# Patient Record
Sex: Female | Born: 1985
Health system: Southern US, Community
[De-identification: ages and names within clinical notes are randomized; demographics above are authoritative.]

## PROBLEM LIST (undated history)

## (undated) DIAGNOSIS — K589 Irritable bowel syndrome without diarrhea: Secondary | ICD-10-CM

## (undated) DIAGNOSIS — E559 Vitamin D deficiency, unspecified: Secondary | ICD-10-CM

## (undated) HISTORY — DX: Irritable bowel syndrome, unspecified: K58.9

## (undated) HISTORY — DX: Vitamin D deficiency, unspecified: E55.9

## (undated) HISTORY — PX: WISDOM TOOTH EXTRACTION: SHX21

---

## 2015-12-09 DIAGNOSIS — F4323 Adjustment disorder with mixed anxiety and depressed mood: Secondary | ICD-10-CM | POA: Diagnosis not present

## 2016-01-07 DIAGNOSIS — F4323 Adjustment disorder with mixed anxiety and depressed mood: Secondary | ICD-10-CM | POA: Diagnosis not present

## 2016-01-13 DIAGNOSIS — F4323 Adjustment disorder with mixed anxiety and depressed mood: Secondary | ICD-10-CM | POA: Diagnosis not present

## 2016-01-28 DIAGNOSIS — F4323 Adjustment disorder with mixed anxiety and depressed mood: Secondary | ICD-10-CM | POA: Diagnosis not present

## 2016-02-16 DIAGNOSIS — F4323 Adjustment disorder with mixed anxiety and depressed mood: Secondary | ICD-10-CM | POA: Diagnosis not present

## 2016-03-04 DIAGNOSIS — F4323 Adjustment disorder with mixed anxiety and depressed mood: Secondary | ICD-10-CM | POA: Diagnosis not present

## 2016-03-14 DIAGNOSIS — F4323 Adjustment disorder with mixed anxiety and depressed mood: Secondary | ICD-10-CM | POA: Diagnosis not present

## 2016-03-25 DIAGNOSIS — F4323 Adjustment disorder with mixed anxiety and depressed mood: Secondary | ICD-10-CM | POA: Diagnosis not present

## 2016-04-25 DIAGNOSIS — F4323 Adjustment disorder with mixed anxiety and depressed mood: Secondary | ICD-10-CM | POA: Diagnosis not present

## 2016-05-09 DIAGNOSIS — F4323 Adjustment disorder with mixed anxiety and depressed mood: Secondary | ICD-10-CM | POA: Diagnosis not present

## 2016-05-23 DIAGNOSIS — F4323 Adjustment disorder with mixed anxiety and depressed mood: Secondary | ICD-10-CM | POA: Diagnosis not present

## 2016-06-06 DIAGNOSIS — F4323 Adjustment disorder with mixed anxiety and depressed mood: Secondary | ICD-10-CM | POA: Diagnosis not present

## 2016-07-19 DIAGNOSIS — J069 Acute upper respiratory infection, unspecified: Secondary | ICD-10-CM | POA: Diagnosis not present

## 2016-09-13 DIAGNOSIS — F4323 Adjustment disorder with mixed anxiety and depressed mood: Secondary | ICD-10-CM | POA: Diagnosis not present

## 2016-11-30 DIAGNOSIS — Z113 Encounter for screening for infections with a predominantly sexual mode of transmission: Secondary | ICD-10-CM | POA: Diagnosis not present

## 2016-11-30 DIAGNOSIS — Z7189 Other specified counseling: Secondary | ICD-10-CM | POA: Diagnosis not present

## 2017-04-13 DIAGNOSIS — F4323 Adjustment disorder with mixed anxiety and depressed mood: Secondary | ICD-10-CM | POA: Diagnosis not present

## 2017-05-05 ENCOUNTER — Encounter: Payer: Self-pay | Admitting: Urgent Care

## 2017-05-05 ENCOUNTER — Ambulatory Visit (INDEPENDENT_AMBULATORY_CARE_PROVIDER_SITE_OTHER): Payer: BLUE CROSS/BLUE SHIELD | Admitting: Urgent Care

## 2017-05-05 VITALS — BP 95/55 | HR 87 | Temp 98.2°F | Resp 17 | Ht 67.5 in | Wt 128.0 lb

## 2017-05-05 DIAGNOSIS — Z7189 Other specified counseling: Secondary | ICD-10-CM

## 2017-05-05 DIAGNOSIS — Z7185 Encounter for immunization safety counseling: Secondary | ICD-10-CM

## 2017-05-05 NOTE — Progress Notes (Signed)
MRN: [id number] DOB: 01-Nov-1985  Subjective:   Melanie Ho is a 31 y.o. female presenting for chief complaint of Immunizations (varicella)  Reports that she needs to verify her immunity to varicella. Reports that she had chicken pox at ~31 year old. Her immunizations are otherwise up to date per patient.  Melanie Ho is not currently taking any medications. Also has No Known Allergies.  Melanie Ho denies past medical and surgical history.   Objective:   Vitals: BP (!) 95/55   Pulse 87   Temp 98.2 F (36.8 C) (Oral)   Resp 17   Ht 5' 7.5" (1.715 m)   Wt 128 lb (58.1 kg)   LMP 04/21/2017 (Approximate)   SpO2 98%   BMI 19.75 kg/m   Physical Exam  Constitutional: She is oriented to person, place, and time. She appears well-developed and well-nourished.  Cardiovascular: Normal rate.   Pulmonary/Chest: Effort normal.  Neurological: She is alert and oriented to person, place, and time.  Psychiatric: She has a normal mood and affect.   Assessment and Plan :   1. Encounter for counseling regarding immunization - Labs pending, will follow up with lab results. - Varicella zoster antibody, IgG   Wallis Bamberg, PA-C [facility] [phone] 05/05/2017  8:55 AM

## 2017-05-05 NOTE — Patient Instructions (Addendum)
Varicella (Chickenpox) Vaccine: What You Need to Know 1. Why get vaccinated? Varicella (also called chickenpox) is a very contagious viral disease. It is caused by the varicella zoster virus. Chickenpox is usually mild, but it can be serious in infants under 53 months of age, adolescents, adults, pregnant women, and people with weakened immune systems. Chickenpox causes an itchy rash that usually lasts about a week. It can also cause:  fever  tiredness  loss of appetite  headache  More serious complications can include:  skin infections  infection of the lungs (pneumonia)  inflammation of blood vessels  swelling of the brain and/or spinal cord coverings (encephalitis or meningitis)  blood stream, bone, or joint infections  Some people get so sick that they need to be hospitalized. It doesn't happen often, but people can die from chickenpox. Before varicella vaccine, almost everyone in the [place] got chickenpox, an average of 4 million people each year. Children who get chickenpox usually miss at least 5 or 6 days of school or childcare. Some people who get chickenpox get a painful rash called shingles (also known as herpes zoster) years later. Chickenpox can spread easily from an infected person to anyone who has not had chickenpox and has not gotten chickenpox vaccine. 2. Chickenpox vaccine Children 12 months through 26 years of age should get 2 doses of chickenpox vaccine, usually:  First dose: 12 through 34 months of age  Second dose: 37 through 32 years of age  People 45 years of age or older who didn't get the vaccine when they were younger, and have never had chickenpox, should get 2 doses at least 28 days apart. A person who previously received only one dose of chickenpox vaccine should receive a second dose to complete the series. The second dose should be given at least 3 months after the first dose for those younger than 13 years, and at least 28 days after the  first dose for those 6 years of age or older. There are no known risks to getting chickenpox vaccine at the same time as other vaccines. There is a combination vaccine called MMRV that contains both chickenpox and MMR vaccines. MMRV is an option for some children 12 months through 43 years of age. There is a separate Vaccine Information Statement for MMRV. Your health care provider can give you more information. 3. Some people should not get this vaccine Tell your vaccine provider if the person getting the vaccine:  Has any severe, life-threatening allergies. A person who has ever had a life-threatening allergic reaction after a dose of chickenpox vaccine, or has a severe allergy to any part of this vaccine, may be advised not to be vaccinated. Ask your health care provider if you want information about vaccine components.  Is pregnant, or thinks she might be pregnant. Pregnant women should wait to get chickenpox vaccine until after they are no longer pregnant. Women should avoid getting pregnant for at least 1 month after getting chickenpox vaccine.  Has a weakened immune system due to disease (such as cancer or HIV/AIDS) or medical treatments (such as radiation, immunotherapy, steroids, or chemotherapy).  Has a parent, brother, or sister with a history of immune system problems.  Is taking salicylates (such as aspirin). People should avoid using salicylates for 6 weeks after getting varicella vaccine.  Has recently had a blood transfusion or received other blood products. You might be advised to postpone chickenpox vaccination for 3 months or more.  Has tuberculosis.  Has gotten  any other vaccines in the past 4 weeks. Live vaccines given too close together might not work as well.  Is not feeling well. A mild illness, such as a cold, is usually not a reason to postpone a vaccination. Someone who is moderately or severely ill should probably wait. Your doctor can advise you.  4. Risks of a  vaccine reaction With any medicine, including vaccines, there is a chance of reactions. These are usually mild and go away on their own, but serious reactions are also possible. Getting chickenpox vaccine is much safer than getting chickenpox disease. Most people who get chickenpox vaccine do not have any problems with it. After chickenpox vaccination, a person might experience: Minor events:  Sore arm from the injection  Fever  Redness or rash at the injection site If these events happen, they usually begin within 2 weeks after the shot. They occur less often after the second dose. More serious events following chickenpox vaccination are rare. They can include:  Seizure (jerking or staring) often associated with fever  Infection of the lungs (pneumonia) or the brain and spinal cord coverings (meningitis)  Rash all over the body  A person who develops a rash after chickenpox vaccination might be able to spread the varicella vaccine virus to an unprotected person. Even though this happens very rarely, anyone who gets a rash should stay away from people with weakened immune systems and unvaccinated infants until the rash goes away. Talk with your health care provider to learn more. Other things that could happen after this vaccine:  People sometimes faint after medical procedures, including vaccination. Sitting or lying down for about 15 minutes can help prevent fainting and injuries caused by a fall. Tell your doctor if you feel dizzy or have vision changes or ringing in the ears.  Some people get shoulder pain that can be more severe and longer-lasting than routine soreness that can follow injections. This happens very rarely.  Any medication can cause a severe allergic reaction. Such reactions to a vaccine are estimated at about 1 in a million doses, and would happen within a few minutes to a few hours after the vaccination. As with any medicine, there is a very remote chance of a  vaccine causing a serious injury or death. The safety of vaccines is always being monitored. For more information, visit: http://www.aguilar.org/ 5. What if there is a serious problem? What should I look for?  Look for anything that concerns you, such as signs of a severe allergic reaction, very high fever, or unusual behavior. Signs of a severe allergic reaction can include hives, swelling of the face and throat, difficulty breathing, a fast heartbeat, dizziness, and weakness. These would usually start a few minutes to a few hours after the vaccination. What should I do?  If you think it is a severe allergic reaction or other emergency that can't wait, call 9-1-1 and get to the nearest hospital. Otherwise, call your health care provider. Afterward, the reaction should be reported to the Vaccine Adverse Event Reporting System (VAERS). Your doctor should file this report, or you can do it yourself through the VAERS web site atwww.vaers.SamedayNews.es, or by calling 551-575-4386. VAERS does not give medical advice. 6. The National Vaccine Injury Compensation Program The [employer] Vaccine Injury Compensation Program (VICP) is a federal program that was created to compensate people who may have been injured by certain vaccines. Persons who believe they may have been injured by a vaccine can learn about the program and  about filing a claim by calling 267-209-7061 or visiting the [employer] website at GoldCloset.com.ee. There is a time limit to file a claim for compensation. 7. How can I learn more?  Ask your health care provider. He or she can give you the vaccine package insert or suggest other sources of information.  Call your local or state health department.  Contact the Centers for Disease Control and Prevention (CDC): ? Call 251-041-5927 (1-800-CDC-INFO) or ? Visit the CDC's website at http://hunter.com/ CDC Vaccine Information Statement (VIS) Varicella Vaccine  (10/10/2016) This information is not intended to replace advice given to you by your health care provider. Make sure you discuss any questions you have with your health care provider. Document Released: 06/09/2006 Document Revised: 10/25/2016 Document Reviewed: 10/25/2016 Elsevier Interactive Patient Education  2018 [employer].     IF you received an x-ray today, you will receive an invoice from [facility]. Please contact [facility] at [phone] with questions or concerns regarding your invoice.   IF you received labwork today, you will receive an invoice from [facility]. Please contact [facility] at [phone] with questions or concerns regarding your invoice.   Our billing staff will not be able to assist you with questions regarding bills from these companies.  You will be contacted with the lab results as soon as they are available. The fastest way to get your results is to activate your My Chart account. Instructions are located on the last page of this paperwork. If you have not heard from [place] regarding the results in 2 weeks, please contact this office.

## 2017-05-06 LAB — VARICELLA ZOSTER ANTIBODY, IGG: Varicella zoster IgG: 916 index (ref >165)

## 2017-05-09 ENCOUNTER — Encounter: Payer: Self-pay | Admitting: Radiology

## 2017-06-09 DIAGNOSIS — Z30011 Encounter for initial prescription of contraceptive pills: Secondary | ICD-10-CM | POA: Diagnosis not present

## 2017-06-09 DIAGNOSIS — Z01419 Encounter for gynecological examination (general) (routine) without abnormal findings: Secondary | ICD-10-CM | POA: Diagnosis not present

## 2017-07-07 DIAGNOSIS — R109 Unspecified abdominal pain: Secondary | ICD-10-CM | POA: Diagnosis not present

## 2017-07-07 DIAGNOSIS — L659 Nonscarring hair loss, unspecified: Secondary | ICD-10-CM | POA: Diagnosis not present

## 2017-07-11 DIAGNOSIS — F4323 Adjustment disorder with mixed anxiety and depressed mood: Secondary | ICD-10-CM | POA: Diagnosis not present

## 2017-08-01 DIAGNOSIS — F4323 Adjustment disorder with mixed anxiety and depressed mood: Secondary | ICD-10-CM | POA: Diagnosis not present

## 2017-08-09 ENCOUNTER — Other Ambulatory Visit: Payer: Self-pay | Admitting: Gastroenterology

## 2017-08-09 DIAGNOSIS — E611 Iron deficiency: Secondary | ICD-10-CM | POA: Diagnosis not present

## 2017-08-09 DIAGNOSIS — R1030 Lower abdominal pain, unspecified: Secondary | ICD-10-CM | POA: Diagnosis not present

## 2017-08-15 ENCOUNTER — Ambulatory Visit
Admission: RE | Admit: 2017-08-15 | Discharge: 2017-08-15 | Disposition: A | Discharge status: Home / Self Care | Payer: BLUE CROSS/BLUE SHIELD | Source: Ambulatory Visit | Attending: Gastroenterology | Admitting: Gastroenterology

## 2017-08-15 DIAGNOSIS — F4323 Adjustment disorder with mixed anxiety and depressed mood: Secondary | ICD-10-CM | POA: Diagnosis not present

## 2017-08-15 DIAGNOSIS — F909 Attention-deficit hyperactivity disorder, unspecified type: Secondary | ICD-10-CM | POA: Diagnosis not present

## 2017-08-15 DIAGNOSIS — K7689 Other specified diseases of liver: Secondary | ICD-10-CM | POA: Diagnosis not present

## 2017-08-15 DIAGNOSIS — R1030 Lower abdominal pain, unspecified: Secondary | ICD-10-CM

## 2017-08-15 IMAGING — CT CT ABD-PELV W/ CM
2 of 4 series · 10 of 36 positions shown, 17 images · IV contrast (READICAT/WATER & [ID] ISOVUE 300)
Comparison: None.

CLINICAL DATA: Recurrent lower pelvic pain, left greater than
right. Abdominal bloating. Symptoms for 1 year.

EXAM:
CT ABDOMEN AND PELVIS WITH CONTRAST
TECHNIQUE: Multidetector CT imaging of the abdomen and pelvis was performed
using the standard protocol following bolus administration of
intravenous contrast.
CONTRAST:  100 mL of [DE] intravenous contrast

[Series 3: abd/pelvis with · axial · 0.66mm/px · z∈[-358,-38]mm · 9 of 80 slices shown, 15 images]
[im 8/80  soft-tissue]
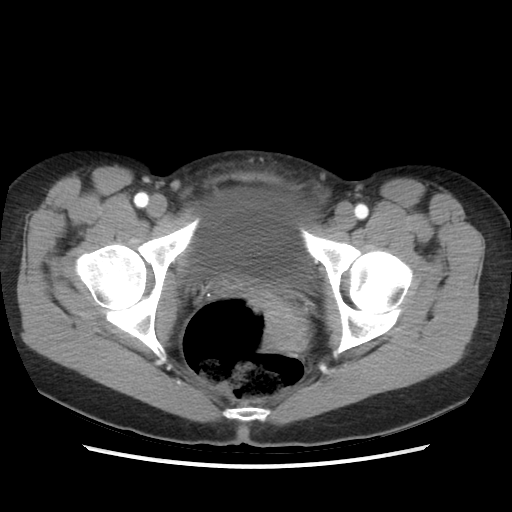
[im 8/80  bone]
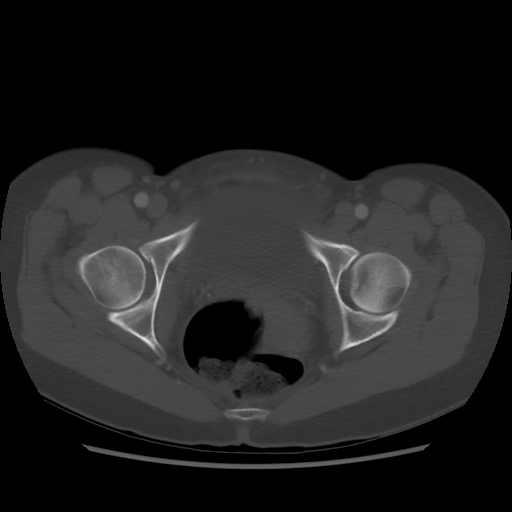
[im 16/80  soft-tissue]
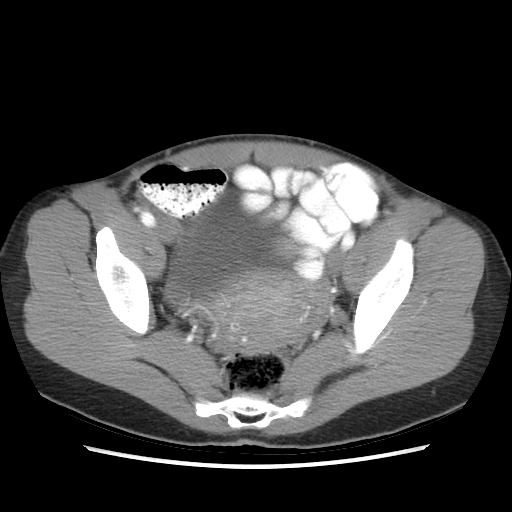
[im 24/80  soft-tissue]
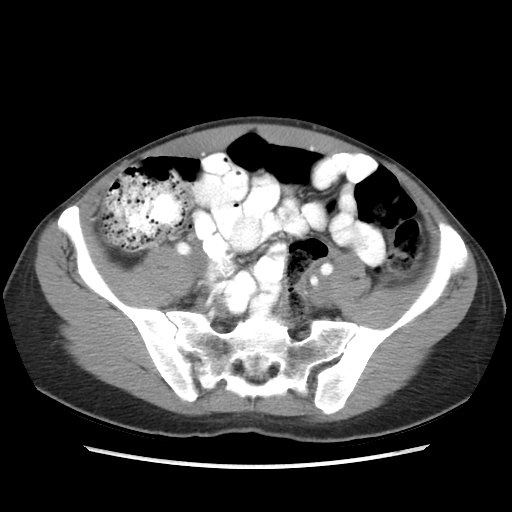
[im 32/80  soft-tissue]
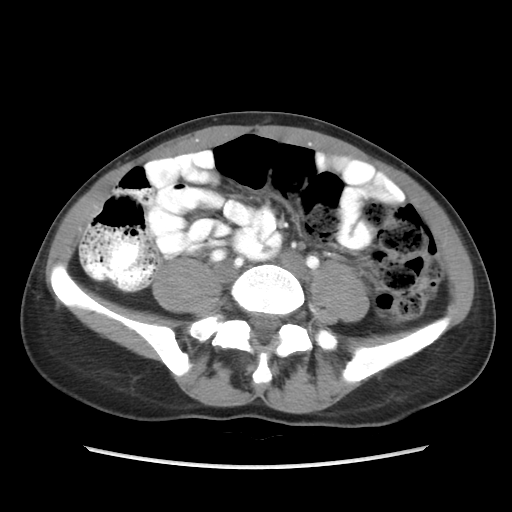
[im 40/80  soft-tissue]
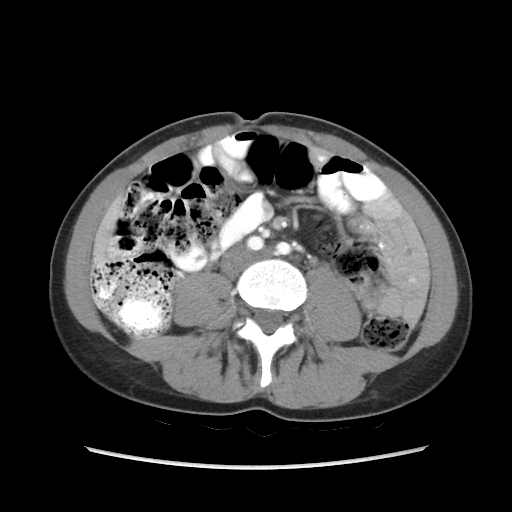
[im 48/80  soft-tissue]
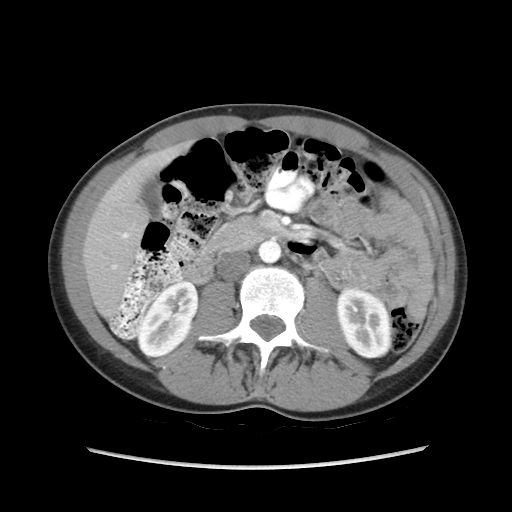
[im 48/80  lung]
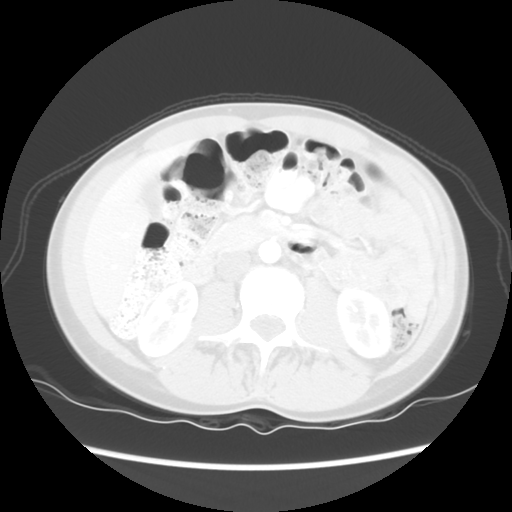
[im 56/80  soft-tissue]
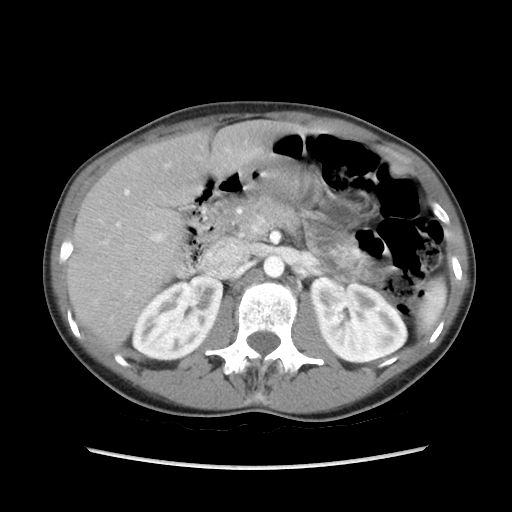
[im 56/80  lung]
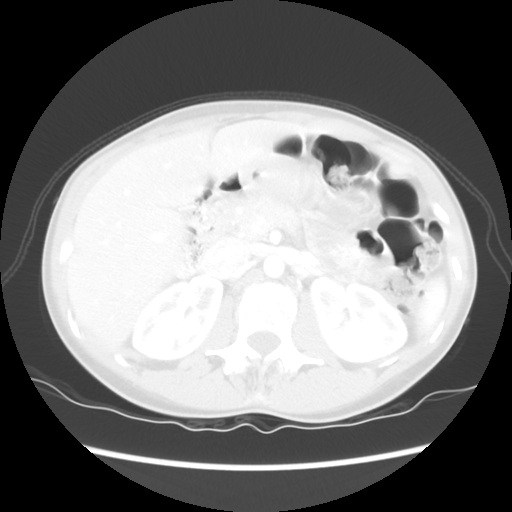
[im 64/80  soft-tissue]
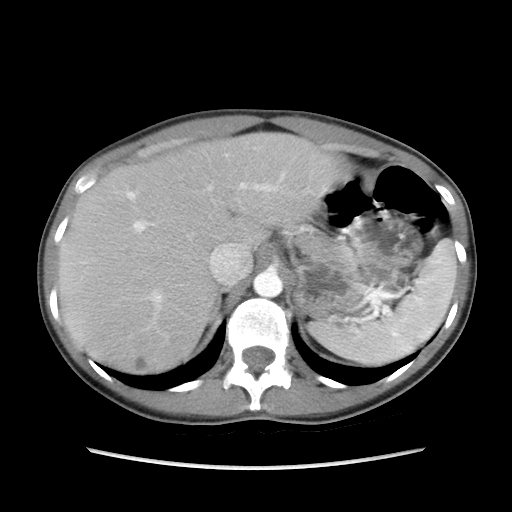
[im 64/80  lung]
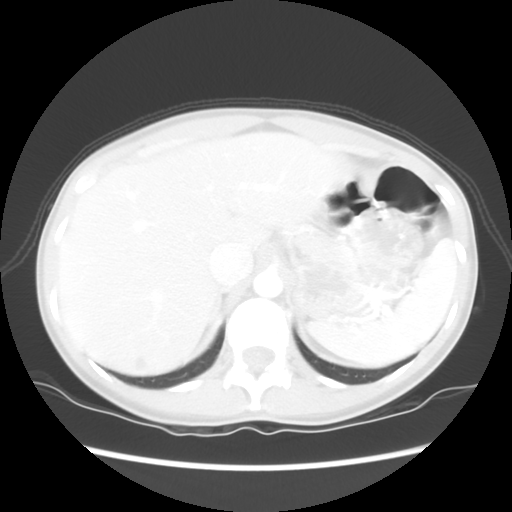
[im 72/80  soft-tissue]
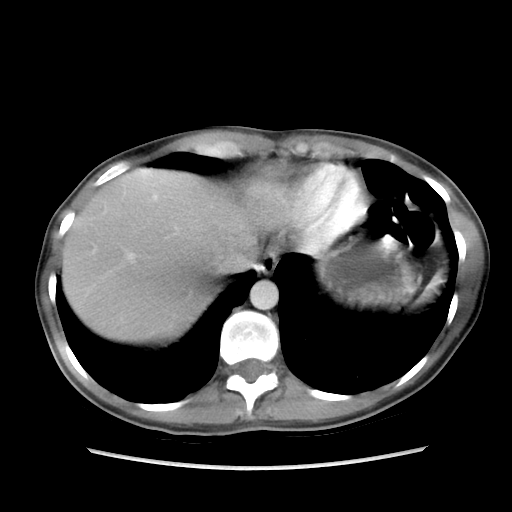
[im 72/80  lung]
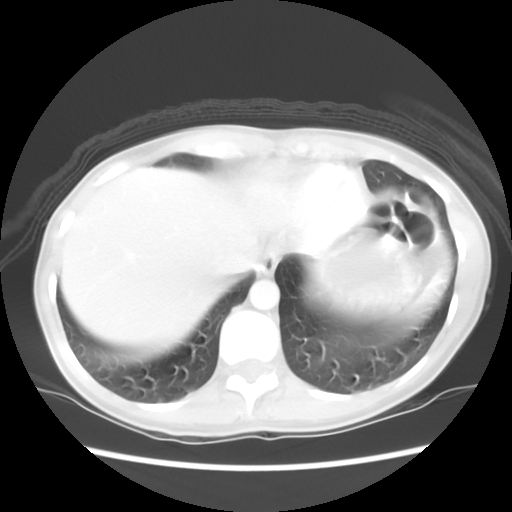
[im 72/80  bone]
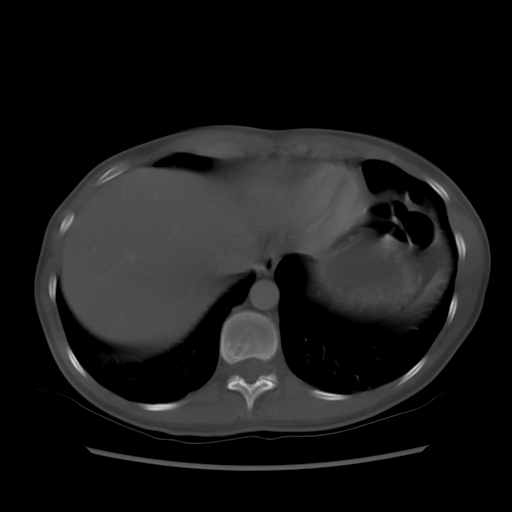

[Series 601: coronal body · coronal · 0.87mm/px · 1 of 103 slices shown, 2 images]
[im 35/103  soft-tissue]
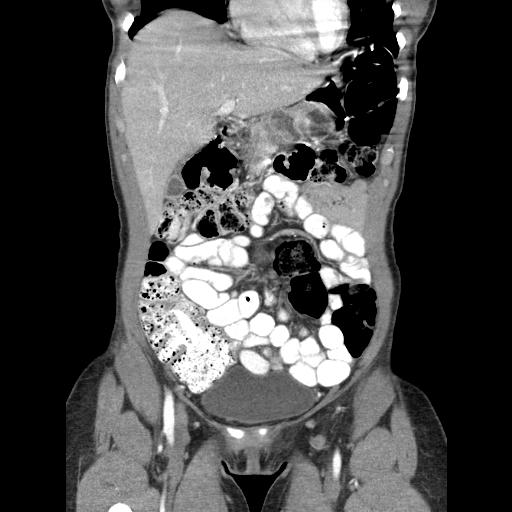
[im 35/103  bone]
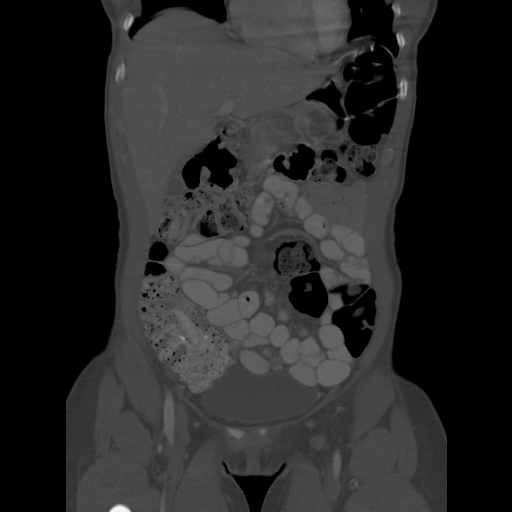

[10 of 36 positions shown; findings below may reference images not displayed]

FINDINGS: Lower chest: Clear lung bases.  Heart normal in size.

Hepatobiliary: 8 mm low-density lesion posteriorly, segment 7,
consistent with a cyst. No other liver abnormalities. Normal
gallbladder. No bile duct dilation.

Pancreas: Unremarkable. No pancreatic ductal dilatation or
surrounding inflammatory changes.

Spleen: Normal in size without focal abnormality.

Adrenals/Urinary Tract: Adrenal glands are unremarkable. Kidneys are
normal, without renal calculi, focal lesion, or hydronephrosis.
Bladder is unremarkable.

Stomach/Bowel: Stomach and small bowel are unremarkable. The colon
is mildly distended with moderately increased stool diffusely. There
is no colonic wall thickening or inflammation. A normal appendix is
visualized.

Vascular/Lymphatic: No significant vascular findings are present. No
enlarged abdominal or pelvic lymph nodes.

Reproductive: Uterus and bilateral adnexa are unremarkable.

Other: No abdominal wall hernia or abnormality. No abdominopelvic
ascites.

Musculoskeletal: No acute or significant osseous findings.
IMPRESSION: 1. No acute abnormalities.
2. Moderate increased stool throughout the colon consistent with
constipation. No bowel inflammation.
3. 8 mm liver cyst.  No other abnormalities.

## 2017-09-04 DIAGNOSIS — K589 Irritable bowel syndrome without diarrhea: Secondary | ICD-10-CM | POA: Diagnosis not present

## 2017-09-04 DIAGNOSIS — K5900 Constipation, unspecified: Secondary | ICD-10-CM | POA: Diagnosis not present

## 2017-09-04 DIAGNOSIS — K7689 Other specified diseases of liver: Secondary | ICD-10-CM | POA: Diagnosis not present

## 2017-09-12 DIAGNOSIS — Z713 Dietary counseling and surveillance: Secondary | ICD-10-CM | POA: Diagnosis not present

## 2017-09-18 DIAGNOSIS — F419 Anxiety disorder, unspecified: Secondary | ICD-10-CM | POA: Diagnosis not present

## 2017-09-18 DIAGNOSIS — F4323 Adjustment disorder with mixed anxiety and depressed mood: Secondary | ICD-10-CM | POA: Diagnosis not present

## 2017-09-18 DIAGNOSIS — F900 Attention-deficit hyperactivity disorder, predominantly inattentive type: Secondary | ICD-10-CM | POA: Diagnosis not present

## 2017-10-05 DIAGNOSIS — F4323 Adjustment disorder with mixed anxiety and depressed mood: Secondary | ICD-10-CM | POA: Diagnosis not present

## 2017-10-06 DIAGNOSIS — L659 Nonscarring hair loss, unspecified: Secondary | ICD-10-CM | POA: Diagnosis not present

## 2017-10-06 DIAGNOSIS — K589 Irritable bowel syndrome without diarrhea: Secondary | ICD-10-CM | POA: Diagnosis not present

## 2017-10-06 DIAGNOSIS — K5900 Constipation, unspecified: Secondary | ICD-10-CM | POA: Diagnosis not present

## 2017-10-16 DIAGNOSIS — N898 Other specified noninflammatory disorders of vagina: Secondary | ICD-10-CM | POA: Diagnosis not present

## 2017-10-16 DIAGNOSIS — F419 Anxiety disorder, unspecified: Secondary | ICD-10-CM | POA: Diagnosis not present

## 2017-10-16 DIAGNOSIS — F324 Major depressive disorder, single episode, in partial remission: Secondary | ICD-10-CM | POA: Diagnosis not present

## 2017-10-16 DIAGNOSIS — F900 Attention-deficit hyperactivity disorder, predominantly inattentive type: Secondary | ICD-10-CM | POA: Diagnosis not present

## 2017-10-24 DIAGNOSIS — Z713 Dietary counseling and surveillance: Secondary | ICD-10-CM | POA: Diagnosis not present

## 2017-11-07 DIAGNOSIS — Z713 Dietary counseling and surveillance: Secondary | ICD-10-CM | POA: Diagnosis not present

## 2017-11-15 DIAGNOSIS — F4323 Adjustment disorder with mixed anxiety and depressed mood: Secondary | ICD-10-CM | POA: Diagnosis not present

## 2017-11-21 DIAGNOSIS — Z713 Dietary counseling and surveillance: Secondary | ICD-10-CM | POA: Diagnosis not present

## 2017-11-22 DIAGNOSIS — R195 Other fecal abnormalities: Secondary | ICD-10-CM | POA: Diagnosis not present

## 2017-11-28 DIAGNOSIS — F4323 Adjustment disorder with mixed anxiety and depressed mood: Secondary | ICD-10-CM | POA: Diagnosis not present

## 2017-12-05 DIAGNOSIS — K5900 Constipation, unspecified: Secondary | ICD-10-CM | POA: Diagnosis not present

## 2017-12-05 DIAGNOSIS — Z713 Dietary counseling and surveillance: Secondary | ICD-10-CM | POA: Diagnosis not present

## 2017-12-11 DIAGNOSIS — F900 Attention-deficit hyperactivity disorder, predominantly inattentive type: Secondary | ICD-10-CM | POA: Diagnosis not present

## 2017-12-11 DIAGNOSIS — F324 Major depressive disorder, single episode, in partial remission: Secondary | ICD-10-CM | POA: Diagnosis not present

## 2017-12-11 DIAGNOSIS — F419 Anxiety disorder, unspecified: Secondary | ICD-10-CM | POA: Diagnosis not present

## 2017-12-26 DIAGNOSIS — K581 Irritable bowel syndrome with constipation: Secondary | ICD-10-CM | POA: Diagnosis not present

## 2018-01-12 DIAGNOSIS — F4323 Adjustment disorder with mixed anxiety and depressed mood: Secondary | ICD-10-CM | POA: Diagnosis not present

## 2018-01-16 DIAGNOSIS — Z713 Dietary counseling and surveillance: Secondary | ICD-10-CM | POA: Diagnosis not present

## 2018-01-22 DIAGNOSIS — F4323 Adjustment disorder with mixed anxiety and depressed mood: Secondary | ICD-10-CM | POA: Diagnosis not present

## 2018-01-30 DIAGNOSIS — Z713 Dietary counseling and surveillance: Secondary | ICD-10-CM | POA: Diagnosis not present

## 2018-02-05 DIAGNOSIS — F4323 Adjustment disorder with mixed anxiety and depressed mood: Secondary | ICD-10-CM | POA: Diagnosis not present

## 2018-02-14 DIAGNOSIS — F324 Major depressive disorder, single episode, in partial remission: Secondary | ICD-10-CM | POA: Diagnosis not present

## 2018-02-14 DIAGNOSIS — F900 Attention-deficit hyperactivity disorder, predominantly inattentive type: Secondary | ICD-10-CM | POA: Diagnosis not present

## 2018-02-14 DIAGNOSIS — F419 Anxiety disorder, unspecified: Secondary | ICD-10-CM | POA: Diagnosis not present

## 2019-05-09 DIAGNOSIS — Z20828 Contact with and (suspected) exposure to other viral communicable diseases: Secondary | ICD-10-CM | POA: Diagnosis not present

## 2019-05-16 DIAGNOSIS — F419 Anxiety disorder, unspecified: Secondary | ICD-10-CM | POA: Diagnosis not present

## 2019-05-16 DIAGNOSIS — F900 Attention-deficit hyperactivity disorder, predominantly inattentive type: Secondary | ICD-10-CM | POA: Diagnosis not present

## 2019-05-16 DIAGNOSIS — F324 Major depressive disorder, single episode, in partial remission: Secondary | ICD-10-CM | POA: Diagnosis not present

## 2019-05-30 DIAGNOSIS — L719 Rosacea, unspecified: Secondary | ICD-10-CM | POA: Diagnosis not present

## 2019-06-19 DIAGNOSIS — Z01419 Encounter for gynecological examination (general) (routine) without abnormal findings: Secondary | ICD-10-CM | POA: Diagnosis not present

## 2020-09-30 DIAGNOSIS — Z01419 Encounter for gynecological examination (general) (routine) without abnormal findings: Secondary | ICD-10-CM | POA: Diagnosis not present

## 2020-10-29 DIAGNOSIS — R69 Illness, unspecified: Secondary | ICD-10-CM | POA: Diagnosis not present

## 2020-12-04 DIAGNOSIS — R69 Illness, unspecified: Secondary | ICD-10-CM | POA: Diagnosis not present

## 2021-02-09 DIAGNOSIS — F324 Major depressive disorder, single episode, in partial remission: Secondary | ICD-10-CM | POA: Diagnosis not present

## 2021-02-09 DIAGNOSIS — F900 Attention-deficit hyperactivity disorder, predominantly inattentive type: Secondary | ICD-10-CM | POA: Diagnosis not present

## 2021-02-09 DIAGNOSIS — F419 Anxiety disorder, unspecified: Secondary | ICD-10-CM | POA: Diagnosis not present

## 2021-02-09 DIAGNOSIS — R69 Illness, unspecified: Secondary | ICD-10-CM | POA: Diagnosis not present

## 2021-02-11 DIAGNOSIS — R69 Illness, unspecified: Secondary | ICD-10-CM | POA: Diagnosis not present

## 2021-02-16 DIAGNOSIS — Z30011 Encounter for initial prescription of contraceptive pills: Secondary | ICD-10-CM | POA: Diagnosis not present

## 2021-03-02 DIAGNOSIS — R69 Illness, unspecified: Secondary | ICD-10-CM | POA: Diagnosis not present

## 2021-03-05 DIAGNOSIS — R1032 Left lower quadrant pain: Secondary | ICD-10-CM | POA: Diagnosis not present

## 2021-04-16 DIAGNOSIS — L821 Other seborrheic keratosis: Secondary | ICD-10-CM | POA: Diagnosis not present

## 2021-04-16 DIAGNOSIS — L219 Seborrheic dermatitis, unspecified: Secondary | ICD-10-CM | POA: Diagnosis not present

## 2021-04-16 DIAGNOSIS — D1801 Hemangioma of skin and subcutaneous tissue: Secondary | ICD-10-CM | POA: Diagnosis not present

## 2021-04-21 DIAGNOSIS — R69 Illness, unspecified: Secondary | ICD-10-CM | POA: Diagnosis not present

## 2021-05-06 DIAGNOSIS — F419 Anxiety disorder, unspecified: Secondary | ICD-10-CM | POA: Diagnosis not present

## 2021-05-06 DIAGNOSIS — F324 Major depressive disorder, single episode, in partial remission: Secondary | ICD-10-CM | POA: Diagnosis not present

## 2021-05-06 DIAGNOSIS — R69 Illness, unspecified: Secondary | ICD-10-CM | POA: Diagnosis not present

## 2021-07-28 DIAGNOSIS — R69 Illness, unspecified: Secondary | ICD-10-CM | POA: Diagnosis not present

## 2021-08-06 DIAGNOSIS — R69 Illness, unspecified: Secondary | ICD-10-CM | POA: Diagnosis not present

## 2021-08-13 DIAGNOSIS — F419 Anxiety disorder, unspecified: Secondary | ICD-10-CM | POA: Diagnosis not present

## 2021-08-13 DIAGNOSIS — F324 Major depressive disorder, single episode, in partial remission: Secondary | ICD-10-CM | POA: Diagnosis not present

## 2021-08-13 DIAGNOSIS — R69 Illness, unspecified: Secondary | ICD-10-CM | POA: Diagnosis not present

## 2021-08-18 DIAGNOSIS — R69 Illness, unspecified: Secondary | ICD-10-CM | POA: Diagnosis not present

## 2021-09-01 DIAGNOSIS — R69 Illness, unspecified: Secondary | ICD-10-CM | POA: Diagnosis not present

## 2021-09-10 DIAGNOSIS — R69 Illness, unspecified: Secondary | ICD-10-CM | POA: Diagnosis not present

## 2021-09-15 DIAGNOSIS — R69 Illness, unspecified: Secondary | ICD-10-CM | POA: Diagnosis not present

## 2021-09-21 DIAGNOSIS — F419 Anxiety disorder, unspecified: Secondary | ICD-10-CM | POA: Diagnosis not present

## 2021-09-21 DIAGNOSIS — R69 Illness, unspecified: Secondary | ICD-10-CM | POA: Diagnosis not present

## 2021-09-21 DIAGNOSIS — F324 Major depressive disorder, single episode, in partial remission: Secondary | ICD-10-CM | POA: Diagnosis not present

## 2021-09-22 DIAGNOSIS — R69 Illness, unspecified: Secondary | ICD-10-CM | POA: Diagnosis not present

## 2021-09-29 DIAGNOSIS — R69 Illness, unspecified: Secondary | ICD-10-CM | POA: Diagnosis not present

## 2021-10-06 DIAGNOSIS — R69 Illness, unspecified: Secondary | ICD-10-CM | POA: Diagnosis not present

## 2021-10-13 DIAGNOSIS — R69 Illness, unspecified: Secondary | ICD-10-CM | POA: Diagnosis not present

## 2021-10-20 DIAGNOSIS — R69 Illness, unspecified: Secondary | ICD-10-CM | POA: Diagnosis not present

## 2021-10-27 DIAGNOSIS — R69 Illness, unspecified: Secondary | ICD-10-CM | POA: Diagnosis not present

## 2021-11-02 DIAGNOSIS — R69 Illness, unspecified: Secondary | ICD-10-CM | POA: Diagnosis not present

## 2021-11-02 DIAGNOSIS — Z3169 Encounter for other general counseling and advice on procreation: Secondary | ICD-10-CM | POA: Diagnosis not present

## 2021-11-03 DIAGNOSIS — R69 Illness, unspecified: Secondary | ICD-10-CM | POA: Diagnosis not present

## 2021-11-10 DIAGNOSIS — R69 Illness, unspecified: Secondary | ICD-10-CM | POA: Diagnosis not present

## 2021-11-17 DIAGNOSIS — R69 Illness, unspecified: Secondary | ICD-10-CM | POA: Diagnosis not present

## 2021-11-23 DIAGNOSIS — R69 Illness, unspecified: Secondary | ICD-10-CM | POA: Diagnosis not present

## 2021-11-23 DIAGNOSIS — F324 Major depressive disorder, single episode, in partial remission: Secondary | ICD-10-CM | POA: Diagnosis not present

## 2021-11-23 DIAGNOSIS — F419 Anxiety disorder, unspecified: Secondary | ICD-10-CM | POA: Diagnosis not present

## 2021-12-06 ENCOUNTER — Telehealth: Payer: Self-pay | Admitting: Family Medicine

## 2021-12-06 NOTE — Telephone Encounter (Signed)
Reached out to setup appt for New GYN possible colpo...left pt a voicemail. ?

## 2021-12-08 DIAGNOSIS — R69 Illness, unspecified: Secondary | ICD-10-CM | POA: Diagnosis not present

## 2021-12-15 DIAGNOSIS — R69 Illness, unspecified: Secondary | ICD-10-CM | POA: Diagnosis not present

## 2021-12-22 DIAGNOSIS — R69 Illness, unspecified: Secondary | ICD-10-CM | POA: Diagnosis not present

## 2022-01-05 DIAGNOSIS — R69 Illness, unspecified: Secondary | ICD-10-CM | POA: Diagnosis not present

## 2022-01-12 DIAGNOSIS — R69 Illness, unspecified: Secondary | ICD-10-CM | POA: Diagnosis not present

## 2022-01-19 DIAGNOSIS — F419 Anxiety disorder, unspecified: Secondary | ICD-10-CM | POA: Diagnosis not present

## 2022-01-19 DIAGNOSIS — F324 Major depressive disorder, single episode, in partial remission: Secondary | ICD-10-CM | POA: Diagnosis not present

## 2022-01-19 DIAGNOSIS — R69 Illness, unspecified: Secondary | ICD-10-CM | POA: Diagnosis not present

## 2022-01-26 DIAGNOSIS — R69 Illness, unspecified: Secondary | ICD-10-CM | POA: Diagnosis not present

## 2022-02-02 DIAGNOSIS — R69 Illness, unspecified: Secondary | ICD-10-CM | POA: Diagnosis not present

## 2022-02-23 DIAGNOSIS — R69 Illness, unspecified: Secondary | ICD-10-CM | POA: Diagnosis not present

## 2022-03-02 DIAGNOSIS — R69 Illness, unspecified: Secondary | ICD-10-CM | POA: Diagnosis not present

## 2022-03-09 DIAGNOSIS — R69 Illness, unspecified: Secondary | ICD-10-CM | POA: Diagnosis not present

## 2022-03-16 DIAGNOSIS — R69 Illness, unspecified: Secondary | ICD-10-CM | POA: Diagnosis not present

## 2022-03-23 DIAGNOSIS — R69 Illness, unspecified: Secondary | ICD-10-CM | POA: Diagnosis not present

## 2022-03-28 DIAGNOSIS — R69 Illness, unspecified: Secondary | ICD-10-CM | POA: Diagnosis not present

## 2022-03-31 ENCOUNTER — Encounter: Payer: BLUE CROSS/BLUE SHIELD | Admitting: Radiology

## 2022-04-06 DIAGNOSIS — R69 Illness, unspecified: Secondary | ICD-10-CM | POA: Diagnosis not present

## 2022-04-13 DIAGNOSIS — R69 Illness, unspecified: Secondary | ICD-10-CM | POA: Diagnosis not present

## 2022-04-20 DIAGNOSIS — R69 Illness, unspecified: Secondary | ICD-10-CM | POA: Diagnosis not present

## 2022-04-21 DIAGNOSIS — D1801 Hemangioma of skin and subcutaneous tissue: Secondary | ICD-10-CM | POA: Diagnosis not present

## 2022-04-21 DIAGNOSIS — L700 Acne vulgaris: Secondary | ICD-10-CM | POA: Diagnosis not present

## 2022-04-21 DIAGNOSIS — L821 Other seborrheic keratosis: Secondary | ICD-10-CM | POA: Diagnosis not present

## 2022-04-21 DIAGNOSIS — Z3A01 Less than 8 weeks gestation of pregnancy: Secondary | ICD-10-CM | POA: Diagnosis not present

## 2022-04-29 DIAGNOSIS — N925 Other specified irregular menstruation: Secondary | ICD-10-CM | POA: Diagnosis not present

## 2022-05-04 DIAGNOSIS — R69 Illness, unspecified: Secondary | ICD-10-CM | POA: Diagnosis not present

## 2022-05-18 DIAGNOSIS — R69 Illness, unspecified: Secondary | ICD-10-CM | POA: Diagnosis not present

## 2022-05-19 DIAGNOSIS — Z3481 Encounter for supervision of other normal pregnancy, first trimester: Secondary | ICD-10-CM | POA: Diagnosis not present

## 2022-05-19 DIAGNOSIS — Z369 Encounter for antenatal screening, unspecified: Secondary | ICD-10-CM | POA: Diagnosis not present

## 2022-05-19 DIAGNOSIS — Z315 Encounter for genetic counseling: Secondary | ICD-10-CM | POA: Diagnosis not present

## 2022-05-23 LAB — OB RESULTS CONSOLE HEPATITIS B SURFACE ANTIGEN: Hepatitis B Surface Ag: NEGATIVE

## 2022-05-23 LAB — OB RESULTS CONSOLE RUBELLA ANTIBODY, IGM: Rubella: IMMUNE

## 2022-05-26 LAB — OB RESULTS CONSOLE GC/CHLAMYDIA
Chlamydia: NEGATIVE
Neisseria Gonorrhea: NEGATIVE

## 2022-05-27 ENCOUNTER — Other Ambulatory Visit: Payer: Self-pay | Admitting: Obstetrics and Gynecology

## 2022-05-27 ENCOUNTER — Other Ambulatory Visit: Payer: Self-pay

## 2022-05-27 DIAGNOSIS — Z363 Encounter for antenatal screening for malformations: Secondary | ICD-10-CM

## 2022-06-01 DIAGNOSIS — R69 Illness, unspecified: Secondary | ICD-10-CM | POA: Diagnosis not present

## 2022-06-08 DIAGNOSIS — Z0000 Encounter for general adult medical examination without abnormal findings: Secondary | ICD-10-CM | POA: Diagnosis not present

## 2022-06-08 DIAGNOSIS — Z331 Pregnant state, incidental: Secondary | ICD-10-CM | POA: Diagnosis not present

## 2022-06-15 DIAGNOSIS — R69 Illness, unspecified: Secondary | ICD-10-CM | POA: Diagnosis not present

## 2022-06-16 LAB — OB RESULTS CONSOLE HIV ANTIBODY (ROUTINE TESTING): HIV: NONREACTIVE

## 2022-06-20 DIAGNOSIS — R69 Illness, unspecified: Secondary | ICD-10-CM | POA: Diagnosis not present

## 2022-06-28 ENCOUNTER — Other Ambulatory Visit: Payer: Self-pay

## 2022-07-01 DIAGNOSIS — R69 Illness, unspecified: Secondary | ICD-10-CM | POA: Diagnosis not present

## 2022-07-06 ENCOUNTER — Encounter: Payer: Self-pay | Admitting: *Deleted

## 2022-07-06 DIAGNOSIS — R69 Illness, unspecified: Secondary | ICD-10-CM | POA: Diagnosis not present

## 2022-07-11 DIAGNOSIS — R69 Illness, unspecified: Secondary | ICD-10-CM | POA: Diagnosis not present

## 2022-07-11 DIAGNOSIS — F3289 Other specified depressive episodes: Secondary | ICD-10-CM | POA: Diagnosis not present

## 2022-07-13 ENCOUNTER — Ambulatory Visit: Payer: 59 | Attending: Obstetrics and Gynecology

## 2022-07-13 ENCOUNTER — Ambulatory Visit: Payer: 59

## 2022-07-13 ENCOUNTER — Encounter: Payer: Self-pay | Admitting: *Deleted

## 2022-07-13 ENCOUNTER — Ambulatory Visit: Payer: 59 | Admitting: *Deleted

## 2022-07-13 ENCOUNTER — Ambulatory Visit: Payer: Self-pay | Admitting: Genetics

## 2022-07-13 VITALS — BP 105/60 | HR 105

## 2022-07-13 DIAGNOSIS — Z8279 Family history of other congenital malformations, deformations and chromosomal abnormalities: Secondary | ICD-10-CM | POA: Diagnosis not present

## 2022-07-13 DIAGNOSIS — O09522 Supervision of elderly multigravida, second trimester: Secondary | ICD-10-CM

## 2022-07-13 DIAGNOSIS — Z148 Genetic carrier of other disease: Secondary | ICD-10-CM | POA: Diagnosis not present

## 2022-07-13 DIAGNOSIS — Z363 Encounter for antenatal screening for malformations: Secondary | ICD-10-CM

## 2022-07-13 NOTE — Progress Notes (Unsigned)
[facility] at [facility] [address] Phone:  [phone]   Fax:  [phone]    Name: Melanie Ho Indication: Advanced Maternal Age Discuss Carrier Screening Results Family History of Neurofibromatosis  DOB: August 14, 1986 Age: 36 y.o.   EDC: 12/07/2022 LMP: 03/02/2022 Referring Provider:  Nigel Bridgeman, CNM  EGA: [redacted]w[redacted]d Genetic Counselor: Teena Dunk, MS, CGC  OB Hx: [id number] Date of Appointment: 07/13/2022  Accompanied by: Her reproductive partner, [name] "Sam" Face to Face Time: 60 Minutes   Medical History:  This is [name]'s 3rd pregnancy. She has had 2 elective losses. Reports she takes prenatal vitamins, vitamin D, zofran, tums, and unisom. Reports she stopped taking buspar 10mg  approximately 3-4 months ago. Denies personal history of diabetes, high blood pressure, thyroid conditions, and seizures. Denies bleeding, infections, and fevers in this pregnancy. Denies using tobacco, alcohol, or street drugs in this pregnancy.   Family History: A pedigree was created and scanned into Epic under the Media tab. Lujain reports her deceased maternal aunt was affected with Neurofibromatosis. See genetic counseling portion of the note below.  Shareen reports two paternal cousins (female) with autism.  Sam reports one paternal cousin with possible autism.  Maternal ethnicity reported as Caucasian and paternal ethnicity reported as Caucasian/Ashkenazi Jewish. Family history not remarkable for consanguinity, individuals with birth defects, intellectual disability, multiple spontaneous abortions, still births, or unexplained neonatal death.     Genetic Counseling:   Advanced Maternal Age. Genetic counseling reviewed with De that as a woman ages, the risk for certain chromosomal conditions, such as Trisomy 30 (Down syndrome), Trisomy 13, and Trisomy 18, increases. These conditions often are not inherited, but instead occur due to an  error in chromosomal division during the formation of sperm and egg cells in a process called nondisjunction. Tranise previously completed cell-free DNA screening (cfDNA) in this pregnancy. This screen analyzes cell-free DNA originating from the placenta that is found in the maternal blood circulation during pregnancy to provide information regarding the presence or absence of extra DNA for chromosomes 13, 18, and 21 as well as the sex chromosomes. Adell's result is low risk, consistent with a female fetus. This screening significantly reduces the risk that the current pregnancy has Down syndrome, Trisomy 65, Trisomy 46, and common sex chromosome conditions, however, the risk is not zero given the limitations of cfDNA. Genetic counseling reviewed this result in detail with 14. Given that cfDNA is only a screening test we discussed amniocentesis for prenatal diagnosis. [name] declined amniocentesis at this time.   Carrier Screening Results. Chiante and Sam have both completed expanded carrier screening through the laboratory [facility]. Please note that both Terressa and Sam's results are scanned into Kajsa's chart under the Media tab in the same document/attachment.  Kenda screened to be a carrier for Spinal Muscular Atrophy (SMA). Positive for one copy of the SMN1 gene. 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 Positive for the likely pathogenic variant c.906+4A>G  in the DYSF gene. Sam screened to NOT be a carrier for both of these conditions.  Sam screened to be a carrier for Glycogen Storage Disease, Type 2 ([place]). Positive for the likely pathogenic variant c.2799+2C>G in the GAA gene. 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 Positive for the pathogenic variant c.932G>A (p.R311Q) in the NR2E3 gene. [name] screened to NOT be a carrier for both of these conditions.  Genetic counseling reviewed these carrier screening  results with [name] and Sam. We reviewed  that when both members of a couple are known to be carriers for the same autosomal recessive condition there is a 1 in 4 (25%) chance for each pregnancy they have together to be affected. Given that Sam screened negative for the two conditions [name] is positive for and [name] screened negative for the two conditions Sam is positive for, we did not deeply discuss the clinical manifestations of each condition. The couple was counseled that a negative result on carrier screening reduces the likelihood of being a carrier but does not rule out the possibility entirely. Given this information it is unlikely for the current pregnancy and any future pregnancies the couple has together to be affected with the conditions they are carriers for, however, the risk is not zero. Finally, Sam was counseled that while Enhanced S-Cone Syndrome is associated with autosomal recessive inheritance, some rare variants in the NR2E3 gene may cause an autosomal dominant form of Retinitis Pigmentosa 37. We discussed that to the laboratory's knowledge, there is insufficient evidence that Sam's variant in the NR2E3 gene causes an autosomal dominant form of Retinitis Pigmentosa 37. Sam shared with genetic counseling that he does not have any known features of Retinitis Pigmentosa 37.  Family history of Neurofibromatosis. Neurofibromatosis is a genetic disorder that causes tumors to form throughout the body. Neurofibromatosis encompasses three distinct disorders: schwannomatosis, neurofibromatosis type 1 (NF1), and neurofibromatosis type 2 (NF2).  Schwannomatosis is a rare disorder that is associated with painful tumors (called schwannomas) that can cause pain, tingling, and weakness. Schwannomatosis is diagnosed most often in people over the age of 19. Pathogenic variants in the genes SMARCB1 and LZTR1 are often found to be the cause of the condition. SMARCB1- and LZTR1-related schwannomatosis are inherited in an autosomal dominant manner  with reduced penetrance. Fewer than 20% of affected individuals have an affected parent.   NF1 is characterized by multiple cafe au lait macules, freckling in the axillary or inguinal regions, cutaneous neurofibromas, and learning disability or behavior problems. About half of people with NF1 have plexiform neurofibromas, but most are internal and not suspected clinically. Less common but potentially more serious manifestations include optic nerve and other central nervous system gliomas, malignant peripheral nerve sheath tumors, scoliosis, tibial dysplasia, vasculopathy, and gestational, endocrine, or pulmonary disease. Penetrance of NF1 is close to 100% and most of the clinical symptoms appear in infancy and childhood. The median life expectancy of individuals with NF1 is at least 8 years lower than in the general population. NF1 has autosomal dominant inheritance. Approximately 50% of individuals with NF1 have an affected parent and 50% have the disorder as the result of a de novo (new) pathogenic variant in the NF1 gene.  NF2 is characterized by bilateral vestibular schwannomas with associated symptoms of conductive hearing loss, tinnitus, and balance dysfunction. The average age of onset is 79 to 24 years and almost all affected individuals develop bilateral vestibular schwannomas by age 8. Affected individuals may also develop schwannomas of other cranial and peripheral nerves, meningiomas, ependymomas, and (very rarely) low-grade astrocytomas. Because NF2 is considered an adult-onset disease, it may be under recognized in children, in whom skin tumors and ocular findings (retinal hamartoma, thickened optic nerves, cortical wedge cataracts, third cranial nerve palsy) may be the first manifestations. Penetrance of NF2 is close to 100% and the average age of death is 36 years. NF2 has autosomal dominant inheritance. Approximately 50% of individuals diagnosed with NF2 have an affected parent and  50% have the  disorder as the result of a de novo (new) pathogenic variant in the NF2 gene.  Grayce reports her maternal aunt was affected with Neurofibromatosis. It was reported that this aunt was diagnosed around 10 years of age and she experienced hearing loss in childhood. It was additionally reported that this aunt passed away at age 61. Given the hearing loss and early age of death genetic counseling discussed with Weronika that we suspect this aunt had NF2 but this cannot be confirmed without reviewing her medical records and genetic testing results. Given that no other members of Nela's maternal family have/had features of NF, her aunt may have had a de novo etiology of the condition. Additionally, given that River's mother is not apparently affected and Shamar is not apparently affected we discussed that it would be unlikely for the current pregnancy to have NF.   Family history of Autism. Zaara reports two paternal cousins (female) with autism. Sam reports one paternal cousin with possible autism. Autism Spectrum Disorder affects approximately 1-2% of the general population in the [place], [place], and [place]. Autism is a neurological and developmental disorder that affects how people interact with others, communicate, learn, and behave. Autism is known as a "spectrum" disorder because there is wide variation in the type and severity of symptoms people experience. Genetic testing for individuals with a clinical diagnosis of Autism yields an explanation in only about 20% of cases, and the remaining 80% of cases are left with unknown etiology. We reviewed that we are unable to test directly for Autism in pregnancy. Given the distant relation of the individuals with Autism to the current pregnancy, the risk for the current pregnancy to also have Autism is not likely to be increased above the general population risk.    Testing/Screening Options:   Amniocentesis for Prenatal Diagnosis. This procedure involves the  removal of a small amount of amniotic fluid from the sac surrounding the pregnancy with the use of a thin needle inserted through the maternal abdomen and uterus. Ultrasound guidance is used throughout the procedure. Possible procedural difficulties and complications that can arise include maternal infection, cramping, bleeding, fluid leakage, and/or pregnancy loss. The risk for pregnancy loss with an amniocentesis is 1/500. Per the [employer]ologists (ACOG) Practice Bulletin 162, all pregnant women should be offered prenatal assessment for aneuploidy by diagnostic testing regardless of maternal age or other risk factors. If indicated, genetic testing that could be ordered on an amniocentesis sample includes a karyotype, microarray, and testing for specific syndromes. A karyotype can detect chromosomal aneuploidies as well as large deletions or duplications of chromosomal material and chromosomal rearrangements. Microarray assesses for smaller pieces of chromosomal material that are missing or extra that fall below the detection range of a karyotype. These chromosomal changes can be associated with various microdeletion or microduplication syndromes that could have impacts on one's health or development. A microarray can also detect some microdeletion and microduplications that have unclear clinical relevance (variants of uncertain significance) and consanguinity.      Patient Plan:  Proceed with: Routine prenatal care Informed consent was obtained. All questions were answered.  Declined: Amniocentesis   Thank you for sharing in the care of Salam with [place].  Please do not hesitate to contact us if you have any questions.  Teena Dunk, MS, CGC Certified Genetic Counselor  Genetic counseling student involved in appointment: Yes (C.D.)

## 2022-07-14 DIAGNOSIS — Z148 Genetic carrier of other disease: Secondary | ICD-10-CM | POA: Insufficient documentation

## 2022-07-27 DIAGNOSIS — R69 Illness, unspecified: Secondary | ICD-10-CM | POA: Diagnosis not present

## 2022-07-27 DIAGNOSIS — F3289 Other specified depressive episodes: Secondary | ICD-10-CM | POA: Diagnosis not present

## 2022-08-03 DIAGNOSIS — F3289 Other specified depressive episodes: Secondary | ICD-10-CM | POA: Diagnosis not present

## 2022-08-03 DIAGNOSIS — R69 Illness, unspecified: Secondary | ICD-10-CM | POA: Diagnosis not present

## 2022-08-10 DIAGNOSIS — R69 Illness, unspecified: Secondary | ICD-10-CM | POA: Diagnosis not present

## 2022-08-10 DIAGNOSIS — F3289 Other specified depressive episodes: Secondary | ICD-10-CM | POA: Diagnosis not present

## 2022-08-17 DIAGNOSIS — F3289 Other specified depressive episodes: Secondary | ICD-10-CM | POA: Diagnosis not present

## 2022-08-17 DIAGNOSIS — R69 Illness, unspecified: Secondary | ICD-10-CM | POA: Diagnosis not present

## 2022-08-29 NOTE — L&D Delivery Note (Signed)
Operative Delivery Note Called by L&D staff d/t Melanie Ho requesting eval for VAVD d/t decels and tachycardia.  I quickly reviewed tracing and deep decels noted and tachy.  I called the RN in the room and requested terb, stop pushing and eval by in house faculty.  Upon my arrival I continued consent discussion after assessing pt and pt and FOB agreeable to proceed.  At 5:44 PM a viable female was delivered via Vaginal, Spontaneous.  Presentation: vertex; Position: Occiput,, Anterior; Station: +2.  Verbal consent: obtained from patient.  Risks and benefits discussed in detail.  Risks include, but are not limited to the risks of anesthesia, bleeding, infection, damage to maternal tissues, fetal cephalhematoma.  There is also the risk of inability to effect vaginal delivery of the head, or shoulder dystocia that cannot be resolved by established maneuvers, leading to the need for emergency cesarean section.  APGAR: 7, 9; weight 7 lb 3.7 oz (3280 g).   Placenta status: , .   Cord:  with the following complications: .  Cord pH: arterial and venous gases sent and pending  Anesthesia:  Epidural Instruments: Mityvac Episiotomy: None Lacerations:  2nd degree  Suture Repair: 2.0 vicryl Est. Blood Loss (mL):  353  Mom to postpartum.  Baby to Couplet care / Skin to Skin.  Delice Lesch 11/25/2022, 6:01 PM

## 2022-09-07 DIAGNOSIS — F411 Generalized anxiety disorder: Secondary | ICD-10-CM | POA: Diagnosis not present

## 2022-09-07 DIAGNOSIS — Z3A27 27 weeks gestation of pregnancy: Secondary | ICD-10-CM | POA: Diagnosis not present

## 2022-09-07 DIAGNOSIS — F3289 Other specified depressive episodes: Secondary | ICD-10-CM | POA: Diagnosis not present

## 2022-09-07 DIAGNOSIS — O09511 Supervision of elderly primigravida, first trimester: Secondary | ICD-10-CM | POA: Diagnosis not present

## 2022-09-08 DIAGNOSIS — Z3402 Encounter for supervision of normal first pregnancy, second trimester: Secondary | ICD-10-CM | POA: Diagnosis not present

## 2022-09-14 DIAGNOSIS — F411 Generalized anxiety disorder: Secondary | ICD-10-CM | POA: Diagnosis not present

## 2022-09-14 DIAGNOSIS — F3289 Other specified depressive episodes: Secondary | ICD-10-CM | POA: Diagnosis not present

## 2022-09-28 DIAGNOSIS — F411 Generalized anxiety disorder: Secondary | ICD-10-CM | POA: Diagnosis not present

## 2022-09-28 DIAGNOSIS — F3289 Other specified depressive episodes: Secondary | ICD-10-CM | POA: Diagnosis not present

## 2022-10-14 DIAGNOSIS — F411 Generalized anxiety disorder: Secondary | ICD-10-CM | POA: Diagnosis not present

## 2022-10-14 DIAGNOSIS — F3289 Other specified depressive episodes: Secondary | ICD-10-CM | POA: Diagnosis not present

## 2022-10-20 DIAGNOSIS — O3663X0 Maternal care for excessive fetal growth, third trimester, not applicable or unspecified: Secondary | ICD-10-CM | POA: Diagnosis not present

## 2022-10-20 DIAGNOSIS — Z3A33 33 weeks gestation of pregnancy: Secondary | ICD-10-CM | POA: Diagnosis not present

## 2022-10-26 DIAGNOSIS — F411 Generalized anxiety disorder: Secondary | ICD-10-CM | POA: Diagnosis not present

## 2022-10-26 DIAGNOSIS — F3289 Other specified depressive episodes: Secondary | ICD-10-CM | POA: Diagnosis not present

## 2022-11-09 DIAGNOSIS — F411 Generalized anxiety disorder: Secondary | ICD-10-CM | POA: Diagnosis not present

## 2022-11-09 DIAGNOSIS — F3289 Other specified depressive episodes: Secondary | ICD-10-CM | POA: Diagnosis not present

## 2022-11-10 DIAGNOSIS — Z369 Encounter for antenatal screening, unspecified: Secondary | ICD-10-CM | POA: Diagnosis not present

## 2022-11-10 LAB — OB RESULTS CONSOLE GBS: GBS: NEGATIVE

## 2022-11-14 LAB — OB RESULTS CONSOLE GBS: GBS: NEGATIVE

## 2022-11-18 DIAGNOSIS — O09519 Supervision of elderly primigravida, unspecified trimester: Secondary | ICD-10-CM | POA: Diagnosis not present

## 2022-11-18 DIAGNOSIS — Z3A37 37 weeks gestation of pregnancy: Secondary | ICD-10-CM | POA: Diagnosis not present

## 2022-11-21 DIAGNOSIS — Z3A37 37 weeks gestation of pregnancy: Secondary | ICD-10-CM | POA: Diagnosis not present

## 2022-11-21 DIAGNOSIS — F411 Generalized anxiety disorder: Secondary | ICD-10-CM | POA: Diagnosis not present

## 2022-11-21 DIAGNOSIS — O09519 Supervision of elderly primigravida, unspecified trimester: Secondary | ICD-10-CM | POA: Diagnosis not present

## 2022-11-21 DIAGNOSIS — F3289 Other specified depressive episodes: Secondary | ICD-10-CM | POA: Diagnosis not present

## 2022-11-24 DIAGNOSIS — O09519 Supervision of elderly primigravida, unspecified trimester: Secondary | ICD-10-CM | POA: Diagnosis not present

## 2022-11-24 DIAGNOSIS — Z3A38 38 weeks gestation of pregnancy: Secondary | ICD-10-CM | POA: Diagnosis not present

## 2022-11-25 ENCOUNTER — Inpatient Hospital Stay (HOSPITAL_COMMUNITY)
Admission: AD | Admit: 2022-11-25 | Discharge: 2022-11-27 | DRG: 807 | Disposition: A | Discharge status: Home / Self Care | Payer: BC Managed Care – PPO | Attending: Obstetrics and Gynecology | Admitting: Obstetrics and Gynecology

## 2022-11-25 ENCOUNTER — Encounter (HOSPITAL_COMMUNITY): Payer: Self-pay | Admitting: Obstetrics and Gynecology

## 2022-11-25 ENCOUNTER — Other Ambulatory Visit: Payer: Self-pay

## 2022-11-25 ENCOUNTER — Inpatient Hospital Stay (HOSPITAL_COMMUNITY): Payer: BC Managed Care – PPO | Admitting: Anesthesiology

## 2022-11-25 DIAGNOSIS — Z148 Genetic carrier of other disease: Secondary | ICD-10-CM | POA: Diagnosis not present

## 2022-11-25 DIAGNOSIS — Z23 Encounter for immunization: Secondary | ICD-10-CM | POA: Diagnosis not present

## 2022-11-25 DIAGNOSIS — F329 Major depressive disorder, single episode, unspecified: Secondary | ICD-10-CM | POA: Diagnosis not present

## 2022-11-25 DIAGNOSIS — Z3A38 38 weeks gestation of pregnancy: Secondary | ICD-10-CM

## 2022-11-25 DIAGNOSIS — F419 Anxiety disorder, unspecified: Secondary | ICD-10-CM | POA: Diagnosis present

## 2022-11-25 DIAGNOSIS — R011 Cardiac murmur, unspecified: Secondary | ICD-10-CM | POA: Diagnosis not present

## 2022-11-25 DIAGNOSIS — O99344 Other mental disorders complicating childbirth: Secondary | ICD-10-CM | POA: Diagnosis not present

## 2022-11-25 DIAGNOSIS — O26893 Other specified pregnancy related conditions, third trimester: Secondary | ICD-10-CM | POA: Diagnosis not present

## 2022-11-25 LAB — HIV ANTIBODY (ROUTINE TESTING W REFLEX): HIV Screen 4th Generation wRfx: NONREACTIVE

## 2022-11-25 LAB — TYPE AND SCREEN
ABO/RH(D): O POS
Antibody Screen: NEGATIVE

## 2022-11-25 LAB — CBC
HCT: 36.1 % (ref 36.0–46.0)
Hemoglobin: 12.1 g/dL (ref 12.0–15.0)
MCH: 29.3 pg (ref 26.0–34.0)
MCHC: 33.5 g/dL (ref 30.0–36.0)
MCV: 87.4 fL (ref 80.0–100.0)
Platelets: 166 10*3/uL (ref 150–400)
RBC: 4.13 MIL/uL (ref 3.87–5.11)
RDW: 13.6 % (ref 11.5–15.5)
WBC: 11.9 10*3/uL — ABNORMAL HIGH (ref 4.0–10.5)
nRBC: 0 % (ref 0.0–0.2)

## 2022-11-25 LAB — RPR: RPR Ser Ql: NONREACTIVE

## 2022-11-25 MED ORDER — PHENYLEPHRINE 80 MCG/ML (10ML) SYRINGE FOR IV PUSH (FOR BLOOD PRESSURE SUPPORT)
80.0000 ug | PREFILLED_SYRINGE | INTRAVENOUS | Status: DC | PRN
  Administered 2022-11-25: 80 ug via INTRAVENOUS
  Filled 2022-11-25: qty 10

## 2022-11-25 MED ORDER — LACTATED RINGERS IV SOLN
500.0000 mL | INTRAVENOUS | Status: DC | PRN
  Administered 2022-11-25: 500 mL via INTRAVENOUS
  Administered 2022-11-25: 1000 mL via INTRAVENOUS

## 2022-11-25 MED ORDER — OXYTOCIN-SODIUM CHLORIDE 30-0.9 UT/500ML-% IV SOLN
1.0000 m[IU]/min | INTRAVENOUS | Status: DC
  Administered 2022-11-25: 2 m[IU]/min via INTRAVENOUS
  Filled 2022-11-25: qty 500

## 2022-11-25 MED ORDER — OXYCODONE HCL 5 MG PO TABS: 5.0000 mg | ORAL_TABLET | ORAL | Status: DC | PRN

## 2022-11-25 MED ORDER — TETANUS-DIPHTH-ACELL PERTUSSIS 5-2.5-18.5 LF-MCG/0.5 IM SUSY: 0.5000 mL | PREFILLED_SYRINGE | Freq: Once | INTRAMUSCULAR | Status: DC

## 2022-11-25 MED ORDER — FENTANYL CITRATE (PF) 100 MCG/2ML IJ SOLN: 50.0000 ug | INTRAMUSCULAR | Status: DC | PRN

## 2022-11-25 MED ORDER — LIDOCAINE HCL (PF) 1 % IJ SOLN: 30.0000 mL | INTRAMUSCULAR | Status: DC | PRN

## 2022-11-25 MED ORDER — FENTANYL-BUPIVACAINE-NACL 0.5-0.125-0.9 MG/250ML-% EP SOLN
12.0000 mL/h | EPIDURAL | Status: DC | PRN
  Administered 2022-11-25: 12 mL/h via EPIDURAL
  Filled 2022-11-25: qty 250

## 2022-11-25 MED ORDER — SOD CITRATE-CITRIC ACID 500-334 MG/5ML PO SOLN: 30.0000 mL | ORAL | Status: DC | PRN

## 2022-11-25 MED ORDER — LACTATED RINGERS IV SOLN
500.0000 mL | Freq: Once | INTRAVENOUS | Status: AC
  Administered 2022-11-25: 500 mL via INTRAVENOUS

## 2022-11-25 MED ORDER — DIBUCAINE (PERIANAL) 1 % EX OINT: 1.0000 | TOPICAL_OINTMENT | CUTANEOUS | Status: DC | PRN

## 2022-11-25 MED ORDER — DIPHENHYDRAMINE HCL 50 MG/ML IJ SOLN: 12.5000 mg | INTRAMUSCULAR | Status: DC | PRN

## 2022-11-25 MED ORDER — ACETAMINOPHEN 325 MG PO TABS: 650.0000 mg | ORAL_TABLET | ORAL | Status: DC | PRN

## 2022-11-25 MED ORDER — ACETAMINOPHEN 325 MG PO TABS
650.0000 mg | ORAL_TABLET | Freq: Four times a day (QID) | ORAL | Status: DC
  Administered 2022-11-26 – 2022-11-27 (×5): 650 mg via ORAL
  Filled 2022-11-25 (×6): qty 2

## 2022-11-25 MED ORDER — PRENATAL MULTIVITAMIN CH
1.0000 | ORAL_TABLET | Freq: Every day | ORAL | Status: DC
  Administered 2022-11-26 – 2022-11-27 (×2): 1 via ORAL
  Filled 2022-11-25 (×2): qty 1

## 2022-11-25 MED ORDER — PHENYLEPHRINE 80 MCG/ML (10ML) SYRINGE FOR IV PUSH (FOR BLOOD PRESSURE SUPPORT)
80.0000 ug | PREFILLED_SYRINGE | INTRAVENOUS | Status: AC | PRN
  Administered 2022-11-25 (×3): 80 ug via INTRAVENOUS

## 2022-11-25 MED ORDER — ONDANSETRON HCL 4 MG/2ML IJ SOLN: 4.0000 mg | INTRAMUSCULAR | Status: DC | PRN

## 2022-11-25 MED ORDER — ONDANSETRON HCL 4 MG/2ML IJ SOLN: 4.0000 mg | Freq: Four times a day (QID) | INTRAMUSCULAR | Status: DC | PRN

## 2022-11-25 MED ORDER — DIPHENHYDRAMINE HCL 25 MG PO CAPS: 25.0000 mg | ORAL_CAPSULE | Freq: Four times a day (QID) | ORAL | Status: DC | PRN

## 2022-11-25 MED ORDER — OXYTOCIN-SODIUM CHLORIDE 30-0.9 UT/500ML-% IV SOLN
2.5000 [IU]/h | INTRAVENOUS | Status: DC
  Administered 2022-11-25: 2.5 [IU]/h via INTRAVENOUS

## 2022-11-25 MED ORDER — SENNOSIDES-DOCUSATE SODIUM 8.6-50 MG PO TABS
2.0000 | ORAL_TABLET | ORAL | Status: DC
  Administered 2022-11-26 – 2022-11-27 (×2): 2 via ORAL
  Filled 2022-11-25 (×2): qty 2

## 2022-11-25 MED ORDER — IBUPROFEN 600 MG PO TABS
600.0000 mg | ORAL_TABLET | Freq: Four times a day (QID) | ORAL | Status: DC
  Administered 2022-11-25 – 2022-11-27 (×7): 600 mg via ORAL
  Filled 2022-11-25 (×7): qty 1

## 2022-11-25 MED ORDER — COCONUT OIL OIL: 1.0000 | TOPICAL_OIL | Status: DC | PRN

## 2022-11-25 MED ORDER — TERBUTALINE SULFATE 1 MG/ML IJ SOLN
0.2500 mg | Freq: Once | INTRAMUSCULAR | Status: AC | PRN
  Administered 2022-11-25: 0.25 mg via SUBCUTANEOUS
  Filled 2022-11-25: qty 1

## 2022-11-25 MED ORDER — FLEET ENEMA 7-19 GM/118ML RE ENEM: 1.0000 | ENEMA | RECTAL | Status: DC | PRN

## 2022-11-25 MED ORDER — BENZOCAINE-MENTHOL 20-0.5 % EX AERO
1.0000 | INHALATION_SPRAY | CUTANEOUS | Status: DC | PRN
  Filled 2022-11-25: qty 56

## 2022-11-25 MED ORDER — BUPROPION HCL ER (XL) 150 MG PO TB24
150.0000 mg | ORAL_TABLET | Freq: Every day | ORAL | Status: DC
  Administered 2022-11-26: 150 mg via ORAL
  Filled 2022-11-25 (×2): qty 1

## 2022-11-25 MED ORDER — OXYCODONE-ACETAMINOPHEN 5-325 MG PO TABS: 2.0000 | ORAL_TABLET | ORAL | Status: DC | PRN

## 2022-11-25 MED ORDER — LACTATED RINGERS IV SOLN: INTRAVENOUS | Status: DC

## 2022-11-25 MED ORDER — OXYTOCIN BOLUS FROM INFUSION
333.0000 mL | Freq: Once | INTRAVENOUS | Status: AC
  Administered 2022-11-25: 333 mL via INTRAVENOUS

## 2022-11-25 MED ORDER — EPHEDRINE 5 MG/ML INJ: 10.0000 mg | INTRAVENOUS | Status: DC | PRN

## 2022-11-25 MED ORDER — OXYCODONE-ACETAMINOPHEN 5-325 MG PO TABS: 1.0000 | ORAL_TABLET | ORAL | Status: DC | PRN

## 2022-11-25 MED ORDER — EPHEDRINE 5 MG/ML INJ
10.0000 mg | INTRAVENOUS | Status: DC | PRN
  Administered 2022-11-25: 10 mg via INTRAVENOUS
  Filled 2022-11-25: qty 5

## 2022-11-25 MED ORDER — WITCH HAZEL-GLYCERIN EX PADS: 1.0000 | MEDICATED_PAD | CUTANEOUS | Status: DC | PRN

## 2022-11-25 MED ORDER — ZOLPIDEM TARTRATE 5 MG PO TABS: 5.0000 mg | ORAL_TABLET | Freq: Every evening | ORAL | Status: DC | PRN

## 2022-11-25 MED ORDER — ONDANSETRON HCL 4 MG PO TABS: 4.0000 mg | ORAL_TABLET | ORAL | Status: DC | PRN

## 2022-11-25 MED ORDER — LIDOCAINE HCL (PF) 1 % IJ SOLN
INTRAMUSCULAR | Status: DC | PRN
  Administered 2022-11-25 (×2): 4 mL via EPIDURAL

## 2022-11-25 MED ORDER — SIMETHICONE 80 MG PO CHEW: 80.0000 mg | CHEWABLE_TABLET | ORAL | Status: DC | PRN

## 2022-11-25 NOTE — Anesthesia Preprocedure Evaluation (Signed)
Anesthesia Evaluation  Patient identified by MRN, date of birth, ID band Patient awake    Reviewed: Allergy & Precautions, Patient's Chart, lab work & pertinent test results  History of Anesthesia Complications Negative for: history of anesthetic complications  Airway Mallampati: II  TM Distance: >3 FB Neck ROM: Full    Dental no notable dental hx.    Pulmonary neg pulmonary ROS   Pulmonary exam normal        Cardiovascular negative cardio ROS Normal cardiovascular exam     Neuro/Psych negative neurological ROS  negative psych ROS   GI/Hepatic negative GI ROS, Neg liver ROS,,,  Endo/Other  negative endocrine ROS    Renal/GU negative Renal ROS  negative genitourinary   Musculoskeletal negative musculoskeletal ROS (+)    Abdominal   Peds  Hematology negative hematology ROS (+)   Anesthesia Other Findings Day of surgery medications reviewed with patient.  Reproductive/Obstetrics (+) Pregnancy                             Anesthesia Physical Anesthesia Plan  ASA: 2  Anesthesia Plan: Epidural   Post-op Pain Management:    Induction:   PONV Risk Score and Plan: Treatment may vary due to age or medical condition  Airway Management Planned: Natural Airway  Additional Equipment: Fetal Monitoring  Intra-op Plan:   Post-operative Plan:   Informed Consent: I have reviewed the patients History and Physical, chart, labs and discussed the procedure including the risks, benefits and alternatives for the proposed anesthesia with the patient or authorized representative who has indicated his/her understanding and acceptance.       Plan Discussed with:   Anesthesia Plan Comments:        Anesthesia Quick Evaluation  

## 2022-11-25 NOTE — MAU Note (Signed)
Pt informed that the ultrasound is considered a limited OB ultrasound and is not intended to be a complete ultrasound exam.  Patient also informed that the ultrasound is not being completed with the intent of assessing for fetal or placental anomalies or any pelvic abnormalities.  Explained that the purpose of today's ultrasound is to assess for  presentation.  Patient acknowledges the purpose of the exam and the limitations of the study.    Vertex presentation verified.  

## 2022-11-25 NOTE — Anesthesia Procedure Notes (Signed)
Epidural Patient location during procedure: OB Start time: 11/25/2022 4:35 AM End time: 11/25/2022 4:38 AM  Staffing Anesthesiologist: Brennan Bailey, MD Performed: anesthesiologist   Preanesthetic Checklist Completed: patient identified, IV checked, risks and benefits discussed, monitors and equipment checked, pre-op evaluation and timeout performed  Epidural Patient position: sitting Prep: DuraPrep and site prepped and draped Patient monitoring: continuous pulse ox, blood pressure and heart rate Approach: midline Location: L3-L4 Injection technique: LOR air  Needle:  Needle type: Tuohy  Needle gauge: 17 G Needle length: 9 cm Needle insertion depth: 5 cm Catheter type: closed end flexible Catheter size: 19 Gauge Catheter at skin depth: 10 cm Test dose: negative and Other (1% lidocaine)  Assessment Events: blood not aspirated, no cerebrospinal fluid, injection not painful, no injection resistance, no paresthesia and negative IV test  Additional Notes Patient identified. Risks, benefits, and alternatives discussed with patient including but not limited to bleeding, infection, nerve damage, paralysis, failed block, incomplete pain control, headache, blood pressure changes, nausea, vomiting, reactions to medication, itching, and postpartum back pain. Confirmed with bedside nurse the patient's most recent platelet count. Confirmed with patient that they are not currently taking any anticoagulation, have any bleeding history, or any family history of bleeding disorders. Patient expressed understanding and wished to proceed. All questions were answered. Sterile technique was used throughout the entire procedure. Please see nursing notes for vital signs.   Crisp LOR after one needle redirection. Test dose was given through epidural catheter and negative prior to continuing to dose epidural or start infusion. Warning signs of high block given to the patient including shortness of breath,  tingling/numbness in hands, complete motor block, or any concerning symptoms with instructions to call for help. Patient was given instructions on fall risk and not to get out of bed. All questions and concerns addressed with instructions to call with any issues or inadequate analgesia.  Reason for block:procedure for pain

## 2022-11-25 NOTE — MAU Note (Addendum)
.  Melanie Ho is a 37 y.o. at [redacted]w[redacted]d here in MAU reporting:  Ctxn starting yesterday around 0400, pt reports have increased in frequency and intensity   Contractions every: 2-4 minutes Onset of ctx: Yesterday Pain score: Pain Assessment Pain Assessment: 0-10 Pain Score: 6  Pain Location: Abdomen Pain Orientation: Lower Pain Descriptors / Indicators: Contraction Pain Frequency: Intermittent Pain Onset: On-going Pain Intervention(s): RN made aware  ROM: Membranes Sac Identifier: Sac 1 Membrane Status: Intact Amount: None  Vaginal Bleeding: Vaginal Bleeding Vag. Bleeding: None  Last SVE: last week closed  Epidural: planning  Fetal Movement: Reports positive FM  FHT: Fetal Heart Rate Mode: External Baseline Rate (A): 121 bpm Multiple birth?: No  There were no vitals filed for this visit.     OB Office: [facility] GBS: Negative HSV: Denies hx of HSV Lab orders placed from triage: MAU Labor Eval

## 2022-11-25 NOTE — H&P (Signed)
Melanie Ho is a 37 y.o. female presenting for complaining of regular contractions. Cervix was 2 cm on arrival and changed to 4 cm. She is admitted to labor and delivery in active labor  Prenatal care provided by [facility].   OB History     Gravida  1   Para      Term      Preterm      AB      Living         SAB      IAB      Ectopic      Multiple      Live Births             Past Medical History:  Diagnosis Date   IBS (irritable bowel syndrome)    Vitamin D deficiency    Past Surgical History:  Procedure Laterality Date   WISDOM TOOTH EXTRACTION     Family History: family history includes Cancer in her father. Social History:  reports that she has never smoked. She has never used smokeless tobacco. She reports that she does not drink alcohol and does not use drugs.     Maternal Diabetes: No Genetic Screening: Normal Maternal Ultrasounds/Referrals: Normal Fetal Ultrasounds or other Referrals:  None Maternal Substance Abuse:  No Significant Maternal Medications:  None Significant Maternal Lab Results:  Group B Strep negative Number of Prenatal Visits:greater than 3 verified prenatal visits Other Comments:  None  Review of Systems  Constitutional: Negative.   HENT: Negative.    Eyes: Negative.   Respiratory: Negative.    Cardiovascular: Negative.   Gastrointestinal: Negative.   Endocrine: Negative.   Genitourinary: Negative.   Musculoskeletal: Negative.   Skin: Negative.   Allergic/Immunologic: Negative.   Neurological: Negative.   Hematological: Negative.   Psychiatric/Behavioral: Negative.     History Dilation: 6 Effacement (%): 80 Station: -1 Exam by:: Dr Christophe Louis Blood pressure (!) 107/59, pulse 60, temperature 98.2 F (36.8 C), temperature source Axillary, resp. rate 17, height 5\' 7"  (1.702 m), weight 75.1 kg, last menstrual period 03/02/2022, SpO2 98 %. Maternal Exam:  Introitus: Normal vulva.   Physical  Exam Vitals reviewed.  Constitutional:      Appearance: Normal appearance.  HENT:     Head: Normocephalic.     Nose: Nose normal.  Cardiovascular:     Rate and Rhythm: Normal rate and regular rhythm.     Pulses: Normal pulses.  Pulmonary:     Effort: Pulmonary effort is normal.  Abdominal:     Tenderness: There is no abdominal tenderness.  Genitourinary:    General: Normal vulva.  Musculoskeletal:        General: Swelling present. Normal range of motion.     Cervical back: Normal range of motion.  Skin:    General: Skin is warm and dry.  Neurological:     General: No focal deficit present.     Mental Status: She is alert and oriented to person, place, and time.  Psychiatric:        Mood and Affect: Mood normal.        Behavior: Behavior normal.     Prenatal labs: ABO, Rh: --/--/O POS (03/29 0223) Antibody: NEG (03/29 0223) Rubella: Immune (09/25 0000) RPR:    HBsAg: Negative (09/25 0000)  HIV: Non Reactive (03/29 0223)  GBS: Negative/-- (03/18 0000)   Assessment/Plan: 38 weeks and 6 days in active labor  - admit to labor and delivery  - planning  epidural for pain management.  - Fetal Well Being Category 1  - Anticipate SVD  Dr. Mancel Bale assuming care at 7 am.  Christophe Louis 11/25/2022, 4:11 AM

## 2022-11-25 NOTE — Progress Notes (Signed)
Subjective:    Discussed birth wishes, details of augmentation process, and pain management. Spouse present and supportive. All questions answered.   Objective:    VS: BP (!) 88/41   Pulse 72   Temp 98.6 F (37 C) (Axillary)   Resp 17   Ht 5\' 7"  (1.702 m)   Wt 75.1 kg   LMP 03/02/2022   SpO2 100%   BMI 25.92 kg/m  FHR : baseline 135 / variability moderate / accelerations present / absent decelerations Toco: contractions every 3-4 minutes  Membranes: AROM, copious, clear Dilation: 7 Effacement (%): 80 Cervical Position: Anterior Station: -2 Presentation: Vertex Exam by:: Burman Foster, CNM  Assessment/Plan:   37 y.o. G1P0 [redacted]w[redacted]d Normal labor  Labor:  protracted active phase, pt requests amniotomy, wll start Pitocin if no change in 2 hours Fetal Wellbeing:  Category I Pain Control:  Epidural I/D:   GBS neg Anticipated MOD:  NSVD  Arrie Eastern DNP, CNM 11/25/2022 11:43 AM

## 2022-11-25 NOTE — Lactation Note (Signed)
This note was copied from a baby's chart. Lactation Consultation Note  Patient Name: Boy Annjanette Dung [id number] Date: 11/25/2022 Age:38 hours  Reason for consult: Initial assessment;Mother's request;1st time breastfeeding;Primapara;Term  PARENTS REQUEST that staff WEAR A MASK IN ROOM due to mother's medical status.  P1, GA [redacted]w[redacted]d  Mother states baby has not breast fed since delivery. She said it was a "challenging delivery" and feels baby is tired. He has a VE bruise noted on his scalp and baby is sleeping in mother's arms while skin to skin.   She would like to get lactation help tomorrow. Advised mother to call RN to assist with breastfeeding when baby is awake, showing feeding cues. Discussed hand expression and feeding baby colostrum by spoon. Educated verbally how to hand express and spoon feed. Mother declined assist with hand expression at this time.   Mother given handout with O/P services, breastfeeding support groups, and our phone # for post-discharge questions.       Maternal Data Has patient been taught Hand Expression?: Yes Does the patient have breastfeeding experience prior to this delivery?: No  Feeding Mother's Current Feeding Choice: Breast Milk  LATCH Score Latch: Grasps breast easily, tongue down, lips flanged, rhythmical sucking.  Audible Swallowing: A few with stimulation  Type of Nipple: Everted at rest and after stimulation  Comfort (Breast/Nipple): Soft / non-tender  Hold (Positioning): Assistance needed to correctly position infant at breast and maintain latch.  LATCH Score: 8    Interventions Interventions: Education;LC Services brochure  Discharge Pump: DEBP;Personal  Consult Status Consult Status: Follow-up Date: 11/26/22 Follow-up type: In-patient    Stana Bunting M 11/25/2022, 9:59 PM

## 2022-11-26 LAB — CBC
HCT: 30.9 % — ABNORMAL LOW (ref 36.0–46.0)
Hemoglobin: 10.7 g/dL — ABNORMAL LOW (ref 12.0–15.0)
MCH: 30.3 pg (ref 26.0–34.0)
MCHC: 34.6 g/dL (ref 30.0–36.0)
MCV: 87.5 fL (ref 80.0–100.0)
Platelets: 153 10*3/uL (ref 150–400)
RBC: 3.53 MIL/uL — ABNORMAL LOW (ref 3.87–5.11)
RDW: 14.2 % (ref 11.5–15.5)
WBC: 16 10*3/uL — ABNORMAL HIGH (ref 4.0–10.5)
nRBC: 0 % (ref 0.0–0.2)

## 2022-11-26 NOTE — Progress Notes (Signed)
PPD# 1 SVD w/ 2nd degree perineal Information for the patient's newborn:  Melanie Ho, Melanie Ho [id number]  female   Baby Name Carlis Abbott Circumcision declines   S:   Reports feeling good Tolerating PO fluid and solids No nausea or vomiting Bleeding is light Pain controlled with  PO meds Up ad lib / ambulatory / voiding w/o difficulty Feeding: Breast    O:   VS: BP (!) 99/55 (BP Location: Right Arm)   Pulse 85   Temp (!) 97.5 F (36.4 C) (Axillary)   Resp 17   Ht 5\' 7"  (1.702 m)   Wt 75.1 kg   LMP 03/02/2022   SpO2 100%   Breastfeeding Unknown   BMI 25.92 kg/m   LABS:  Recent Labs    11/25/22 0223 11/26/22 0032  WBC 11.9* 16.0*  HGB 12.1 10.7*  PLT 166 153   Blood type: --/--/O POS (03/29 0223) Rubella: Immune (09/25 0000)                      I&O: Intake/Output      03/29 0701 03/30 0700   Urine (mL/kg/hr) 1525 (0.8)   Blood 353   Total Output 1878   Net -1878       Urine Occurrence 2 x     Physical Exam: Alert and oriented X3 Lungs: Clear and unlabored Heart: regular rate and rhythm / no mumurs Abdomen: soft, non-tender, non-distended  Fundus: firm, non-tender, U-2 Perineum: well-approximated, healing Lochia: appropriate Extremities: no edema, no calf pain, tenderness, or cords    A:  PPD # 1  Normal exam  P:  Routine post partum orders Anticipate D/C on 11/27/22  Plan reviewed w/ Dr. Etheleen Nicks, DNP, CNM 11/26/2022, 7:00 AM

## 2022-11-26 NOTE — Anesthesia Postprocedure Evaluation (Signed)
Anesthesia Post Note  Patient: Melanie Ho  Procedure(s) Performed: AN AD HOC LABOR EPIDURAL     Patient location during evaluation: Mother Baby Anesthesia Type: Epidural Level of consciousness: awake and alert Pain management: pain level controlled Vital Signs Assessment: post-procedure vital signs reviewed and stable Respiratory status: spontaneous breathing, nonlabored ventilation and respiratory function stable Cardiovascular status: stable Postop Assessment: no headache, no backache and epidural receding Anesthetic complications: no   No notable events documented.  Last Vitals:  Vitals:   11/26/22 0200 11/26/22 0624  BP: 106/60 (!) 99/55  Pulse: 90 85  Resp: 17 17  Temp: 36.5 C (!) 36.4 C  SpO2: 100% 100%    Last Pain:  Vitals:   11/26/22 0624  TempSrc: Axillary  PainSc: 3    Pain Goal: Patients Stated Pain Goal: 0 (11/25/22 2200)                 Rayvon Char

## 2022-11-26 NOTE — Progress Notes (Signed)
MOB was referred for history of depression/anxiety. * Referral screened out by Clinical Social Worker because none of the following criteria appear to apply: ~ History of anxiety/depression during this pregnancy, or of post-partum depression following prior delivery. ~ Diagnosis of anxiety and/or depression within last 3 years OR * MOB's symptoms currently being treated with medication and/or therapy. Per prenatal care records, MOB sees a therapist and is actively taking Wellbutrin.  Please contact the Clinical Social Worker if needs arise, by [facility] request, or if MOB scores greater than 9/yes to question 10 on [place] Postpartum Depression Screen.  Signed,  Berniece Salines, MSW, LCSWA, LCASA 11/26/2022 10:05 AM

## 2022-11-26 NOTE — Lactation Note (Addendum)
This note was copied from a baby's chart. Lactation Consultation Note  Patient Name: Melanie Ho [id number] Date: 11/26/2022 Age:38 hours Reason for consult: Follow-up assessment;Mother's request;Difficult latch;Primapara;1st time breastfeeding;Early term 37-38.6wks;Breastfeeding assistance;Nipple pain/trauma  LC entered the room and the infant was feeding on the right breast in the football position.  The infant was latched for 15 min prior to the [facility] entering the room.  The birth parent stated that he has not fed well and this was the first time he had stayed on the breast for this long.  The birth parent asked about pumping and LC let the birth parent know that if the infant is doing well at the breast she does not have to pump unless it is her preference.  LC reviewed milk production and infant stomach size.  LC also let the birth parent know that she should feel tugging and not pinching with the latch.  The infant fed on that side for an additional 10 min.  The birth parent stated that she was feeling pinching.  The birth parent was taught how to break the latch.  There was a small amount of blood noted on the birth parent's right nipple when the infant came off the breast and her nipple was pinched.  LC assisted the birth parent with putting the infant to the left breast in the football position.  LC helped the birth parent to get a deep latch and she stated that there was no more pain.  The infant latched deeply with his tongue down, lips were flanged, sucking was rhythmic, and the infant fed for another 10 min.  The infant was still feeding when [place] left the room.  LC gave the birth parent coconut oil for her right nipple.  All questions were answered.  The birth parent was encouraged to call at the next feeding for assistance.    Maternal Data Has patient been taught Hand Expression?: Yes  Feeding Mother's Current Feeding Choice: Breast Milk  LATCH Score Latch: Grasps breast  easily, tongue down, lips flanged, rhythmical sucking.  Audible Swallowing: Spontaneous and intermittent  Type of Nipple: Everted at rest and after stimulation (Left flat until stimulated)  Comfort (Breast/Nipple): Engorged, cracked, bleeding, large blisters, severe discomfort  Hold (Positioning): Assistance needed to correctly position infant at breast and maintain latch.  LATCH Score: 7   Lactation Tools Discussed/Used Tools: Coconut oil  Interventions Interventions: Breast feeding basics reviewed;Assisted with latch;Adjust position;Support pillows;Coconut oil;Education  Discharge Pump: DEBP  Consult Status Consult Status: Follow-up Date: 11/27/22 Follow-up type: In-patient    Lysbeth Penner 11/26/2022, 10:49 AM

## 2022-11-26 NOTE — Discharge Summary (Signed)
SVD OB Discharge Summary       Patient Name: Melanie Ho DOB: Jan 30, 1986 MRN: [id number]  Date of admission: 11/25/2022 Delivering MD: Everett Graff  Date of delivery: 11/25/2022 Type of delivery: SVD  Newborn Data: Sex: Baby female Circumcision: Declined Live born female  Birth Weight: 7 lb 3.7 oz (3280 g) APGAR: 7, 9  Newborn Delivery   Birth date/time: 11/25/2022 17:44:00 Delivery type: Vaginal, Vacuum (Extractor)      Feeding: breast Infant being discharge to home with mother in stable condition.   Admitting diagnosis: Normal labor [O80, Z37.9] Intrauterine pregnancy: [redacted]w[redacted]d     Secondary diagnosis:  Principal Problem:   SVD Active Problems:   Normal labor   Postpartum care following vaginal delivery                                Complications: None                                                              Intrapartum Procedures: spontaneous vaginal delivery Postpartum Procedures: none Complications-Operative and Postpartum:  2nd degree perineal laceration Augmentation: AROM and Pitocin   History of Present Illness: [name] is a 37 y.o. female, G1P1001, who presents at [redacted]w[redacted]d weeks gestation. The patient has been followed at  [facility]  Her pregnancy has been complicated by:  Patient Active Problem List   Diagnosis Date Noted   SVD 11/26/2022   Postpartum care following vaginal delivery 11/26/2022   Normal labor 11/25/2022   Carrier of spinal muscular atrophy 07/14/2022   Carrier of muscular dystrophy 07/14/2022     Active Ambulatory Problems    Diagnosis Date Noted   Carrier of spinal muscular atrophy 07/14/2022   Carrier of muscular dystrophy 07/14/2022   Resolved Ambulatory Problems    Diagnosis Date Noted   No Resolved Ambulatory Problems   Past Medical History:  Diagnosis Date   IBS (irritable bowel syndrome)    Vitamin D deficiency      Hospital course:  Onset of Labor With Vaginal Delivery       37 y.o. yo G1P1001 at [redacted]w[redacted]d was admitted in Latent Labor on 11/25/2022. Labor course was complicated bypt was admitted on 3/29 with latent labor, pt had VAVD with AROM and pitocin on 3/29 over 2nd degree, ebl 527mls, hgb of 12.1-10.7, had baby female declined circ.  Membrane Rupture Time/Date: 10:45 AM ,11/25/2022   Delivery Method:Vaginal, Vacuum (Extractor)  Episiotomy: None  Lacerations:  2nd degree  Patient had a postpartum course complicated by None.  She is ambulating, tolerating a regular diet, passing flatus, and urinating well. Patient is discharged home in stable condition on 11/26/22.  Newborn Data: Birth date:11/25/2022  Birth time:5:44 PM  Gender:Female  Living status:Living  Apgars:7 ,9  Weight:3280 g  Postpartum Day # 2 : Patient up ad lib, denies syncope or dizziness. Reports consuming regular diet without issues and denies N/V. Patient reports 0 bowel movement + passing flatus.  Denies issues with urination and reports bleeding is "lighter."  Patient is breastfeeding and reports going well.  Desires undecided for postpartum contraception.  Pain is being appropriately managed with use of po meds.   Physical exam  Vitals:  11/26/22 1000 11/26/22 1302 11/26/22 1500 11/26/22 2008  BP: (!) 95/59 (!) 91/56 95/60 105/69  Pulse: 77 87 88 89  Resp: 18 15 16 18   Temp: (!) 97.5 F (36.4 C) (!) 97.4 F (36.3 C) (!) 97.5 F (36.4 C) 97.6 F (36.4 C)  TempSrc: Axillary Axillary Axillary Axillary  SpO2: 100% 99% 98% 99%  Weight:      Height:       General: alert, cooperative, and no distress Lochia: appropriate Uterine Fundus: firm Perineum: approximate DVT Evaluation: No evidence of DVT seen on physical exam. Negative Homan's sign. No cords or calf tenderness. No significant calf/ankle edema.  Labs: Lab Results  Component Value Date   WBC 16.0 (H) 11/26/2022   HGB 10.7 (L) 11/26/2022   HCT 30.9 (L) 11/26/2022   MCV 87.5 11/26/2022   PLT 153 11/26/2022       No data  to display          Date of discharge: 11/26/2022 Discharge Diagnoses: Term Pregnancy-delivered Discharge instruction: per After Visit Summary and "Baby and Me Booklet".  After visit meds:   Activity:           unrestricted and pelvic rest Advance as tolerated. Pelvic rest for 6 weeks.  Diet:                routine Medications: PNV and Ibuprofen Postpartum contraception: Undecided Condition:  Pt discharge to home with baby in stable condition   Meds: Allergies as of 11/26/2022   No Known Allergies   Med Rec must be completed prior to using this [facility]***       Discharge Follow Up:   [facility] Follow up in 6 week(s).   Specialty: Obstetrics and Gynecology Contact information: [address] [phone] [place], [place], PMHNP-BC  [place] [address]  [place], [place] [address]  Cell: [phone]  Office Phone: [phone] Fax: [phone] 11/26/2022  8:42 PM

## 2022-11-27 MED ORDER — BUPROPION HCL ER (XL) 150 MG PO TB24: 150.0000 mg | ORAL_TABLET | Freq: Every day | ORAL | 1 refills | Status: DC

## 2022-11-27 MED ORDER — BUPROPION HCL ER (XL) 150 MG PO TB24: 150.0000 mg | ORAL_TABLET | Freq: Every day | ORAL | Status: DC

## 2022-11-27 MED ORDER — BUPROPION HCL ER (XL) 150 MG PO TB24
150.0000 mg | ORAL_TABLET | Freq: Every day | ORAL | Status: DC
  Administered 2022-11-27: 150 mg via ORAL

## 2022-11-27 MED ORDER — IBUPROFEN 600 MG PO TABS: 600.0000 mg | ORAL_TABLET | Freq: Four times a day (QID) | ORAL | 0 refills | Status: DC

## 2022-11-27 NOTE — Lactation Note (Signed)
This note was copied from a baby's chart. Lactation Consultation Note  Patient Name: Melanie Ho [id number] Date: 11/27/2022 Age:38 hours Reason for consult: Follow-up assessment;Difficult latch;RN request;Infant weight loss;Early term 37-38.6wks;Nipple pain/trauma (5.18% WL)  LC called to the room by the RN.  The birth parent is experiencing some cracking and bleeding nipples when feeding the infant.  LC assessed the infant's oral cavity and he may have a tight labial and lingual frenulum.  The parents were encouraged to speak with the pediatrician and given resources.  LC also spoke with the parents about outpatient lactation support.  They said that they have a postpartum doula that specializes in lactation support.  LC encouraged them to speak with an IBCLC if the doula is not an IBCLC.  The birth parent does not want to latch right now due to the pain.  The birth parent was encouraged to continue to pump every 3 hours.  LC reviewed engorgement, mastitis, warning signs, infant I/O, and outpatient lactation information.  LC provided the birth parent with a hand out on milk storage guidelines.  LC gave the birth parent ice and a towel to use for swelling in her breast and was encouraged to use the ice for 20 min prior to pumping.  All questions were answered.   Infant Feeding Plan:  Pump every 3 hours and feed the infant expressed milk via a bottle.  Supplement with formula as needed and according to supplementation guidelines.  Watch infant output and call the pediatrician with concerns.  Call the outpatient lactation consultant for assistance with breastfeeding.    Maternal Data    Feeding    LATCH Score                    Lactation Tools Discussed/Used Tools: Other (comment) (Ice)  Interventions Interventions: Ice;Education  Discharge Discharge Education: Engorgement and breast care;Warning signs for feeding baby;Outpatient recommendation  Consult  Status Consult Status: Complete Date: 11/27/22 Follow-up type: Call as needed    Lysbeth Penner 11/27/2022, 12:07 PM

## 2022-12-05 ENCOUNTER — Telehealth (HOSPITAL_COMMUNITY): Payer: Self-pay | Admitting: *Deleted

## 2022-12-05 DIAGNOSIS — F3289 Other specified depressive episodes: Secondary | ICD-10-CM | POA: Diagnosis not present

## 2022-12-05 DIAGNOSIS — F411 Generalized anxiety disorder: Secondary | ICD-10-CM | POA: Diagnosis not present

## 2022-12-05 NOTE — Telephone Encounter (Signed)
Attempted hospital discharge follow-up call. Left message for patient to return RN call with any questions or concerns. Deforest Hoyles, RN, 12/05/22, [phone]

## 2022-12-07 ENCOUNTER — Inpatient Hospital Stay (HOSPITAL_COMMUNITY)
Admission: RE | Admit: 2022-12-07 | Payer: BC Managed Care – PPO | Source: Home / Self Care | Admitting: Obstetrics and Gynecology

## 2022-12-12 DIAGNOSIS — F411 Generalized anxiety disorder: Secondary | ICD-10-CM | POA: Diagnosis not present

## 2022-12-12 DIAGNOSIS — F3289 Other specified depressive episodes: Secondary | ICD-10-CM | POA: Diagnosis not present

## 2022-12-15 DIAGNOSIS — Z789 Other specified health status: Secondary | ICD-10-CM | POA: Diagnosis not present

## 2022-12-15 DIAGNOSIS — E559 Vitamin D deficiency, unspecified: Secondary | ICD-10-CM | POA: Diagnosis not present

## 2022-12-15 DIAGNOSIS — F418 Other specified anxiety disorders: Secondary | ICD-10-CM | POA: Diagnosis not present

## 2022-12-15 DIAGNOSIS — M419 Scoliosis, unspecified: Secondary | ICD-10-CM | POA: Diagnosis not present

## 2022-12-19 DIAGNOSIS — F3289 Other specified depressive episodes: Secondary | ICD-10-CM | POA: Diagnosis not present

## 2022-12-19 DIAGNOSIS — F411 Generalized anxiety disorder: Secondary | ICD-10-CM | POA: Diagnosis not present

## 2023-01-02 DIAGNOSIS — F411 Generalized anxiety disorder: Secondary | ICD-10-CM | POA: Diagnosis not present

## 2023-01-02 DIAGNOSIS — F3289 Other specified depressive episodes: Secondary | ICD-10-CM | POA: Diagnosis not present

## 2023-01-06 DIAGNOSIS — Z3040 Encounter for surveillance of contraceptives, unspecified: Secondary | ICD-10-CM | POA: Diagnosis not present

## 2023-01-09 DIAGNOSIS — F411 Generalized anxiety disorder: Secondary | ICD-10-CM | POA: Diagnosis not present

## 2023-01-09 DIAGNOSIS — F3289 Other specified depressive episodes: Secondary | ICD-10-CM | POA: Diagnosis not present

## 2023-01-16 DIAGNOSIS — F3289 Other specified depressive episodes: Secondary | ICD-10-CM | POA: Diagnosis not present

## 2023-01-16 DIAGNOSIS — F411 Generalized anxiety disorder: Secondary | ICD-10-CM | POA: Diagnosis not present

## 2023-02-03 DIAGNOSIS — F3289 Other specified depressive episodes: Secondary | ICD-10-CM | POA: Diagnosis not present

## 2023-02-03 DIAGNOSIS — F411 Generalized anxiety disorder: Secondary | ICD-10-CM | POA: Diagnosis not present

## 2023-02-09 DIAGNOSIS — F411 Generalized anxiety disorder: Secondary | ICD-10-CM | POA: Diagnosis not present

## 2023-02-09 DIAGNOSIS — F3289 Other specified depressive episodes: Secondary | ICD-10-CM | POA: Diagnosis not present

## 2023-02-13 DIAGNOSIS — F3289 Other specified depressive episodes: Secondary | ICD-10-CM | POA: Diagnosis not present

## 2023-02-13 DIAGNOSIS — F411 Generalized anxiety disorder: Secondary | ICD-10-CM | POA: Diagnosis not present

## 2023-03-02 DIAGNOSIS — F3289 Other specified depressive episodes: Secondary | ICD-10-CM | POA: Diagnosis not present

## 2023-03-02 DIAGNOSIS — F411 Generalized anxiety disorder: Secondary | ICD-10-CM | POA: Diagnosis not present

## 2023-03-06 DIAGNOSIS — F3289 Other specified depressive episodes: Secondary | ICD-10-CM | POA: Diagnosis not present

## 2023-03-06 DIAGNOSIS — F411 Generalized anxiety disorder: Secondary | ICD-10-CM | POA: Diagnosis not present

## 2023-03-13 DIAGNOSIS — F411 Generalized anxiety disorder: Secondary | ICD-10-CM | POA: Diagnosis not present

## 2023-03-13 DIAGNOSIS — F3289 Other specified depressive episodes: Secondary | ICD-10-CM | POA: Diagnosis not present

## 2023-03-22 ENCOUNTER — Other Ambulatory Visit (HOSPITAL_BASED_OUTPATIENT_CLINIC_OR_DEPARTMENT_OTHER): Payer: Self-pay

## 2023-03-22 ENCOUNTER — Emergency Department (HOSPITAL_BASED_OUTPATIENT_CLINIC_OR_DEPARTMENT_OTHER): Payer: BC Managed Care – PPO

## 2023-03-22 ENCOUNTER — Emergency Department (HOSPITAL_BASED_OUTPATIENT_CLINIC_OR_DEPARTMENT_OTHER)
Admission: EM | Admit: 2023-03-22 | Discharge: 2023-03-22 | Disposition: A | Discharge status: Home / Self Care | Payer: BC Managed Care – PPO | Attending: Emergency Medicine | Admitting: Emergency Medicine

## 2023-03-22 ENCOUNTER — Other Ambulatory Visit: Payer: Self-pay

## 2023-03-22 ENCOUNTER — Encounter (HOSPITAL_BASED_OUTPATIENT_CLINIC_OR_DEPARTMENT_OTHER): Payer: Self-pay

## 2023-03-22 DIAGNOSIS — Z20822 Contact with and (suspected) exposure to covid-19: Secondary | ICD-10-CM | POA: Diagnosis not present

## 2023-03-22 DIAGNOSIS — H9313 Tinnitus, bilateral: Secondary | ICD-10-CM | POA: Diagnosis not present

## 2023-03-22 DIAGNOSIS — R519 Headache, unspecified: Secondary | ICD-10-CM | POA: Diagnosis not present

## 2023-03-22 DIAGNOSIS — M436 Torticollis: Secondary | ICD-10-CM | POA: Insufficient documentation

## 2023-03-22 LAB — CBC WITH DIFFERENTIAL/PLATELET
Abs Immature Granulocytes: 0.02 10*3/uL (ref 0.00–0.07)
Basophils Absolute: 0 10*3/uL (ref 0.0–0.1)
Basophils Relative: 1 %
Eosinophils Absolute: 0 10*3/uL (ref 0.0–0.5)
Eosinophils Relative: 0 %
HCT: 40.3 % (ref 36.0–46.0)
Hemoglobin: 13.5 g/dL (ref 12.0–15.0)
Immature Granulocytes: 0 %
Lymphocytes Relative: 22 %
Lymphs Abs: 1.3 10*3/uL (ref 0.7–4.0)
MCH: 29.9 pg (ref 26.0–34.0)
MCHC: 33.5 g/dL (ref 30.0–36.0)
MCV: 89.2 fL (ref 80.0–100.0)
Monocytes Absolute: 0.4 10*3/uL (ref 0.1–1.0)
Monocytes Relative: 7 %
Neutro Abs: 4.1 10*3/uL (ref 1.7–7.7)
Neutrophils Relative %: 70 %
Platelets: 227 10*3/uL (ref 150–400)
RBC: 4.52 MIL/uL (ref 3.87–5.11)
RDW: 12.9 % (ref 11.5–15.5)
WBC: 5.9 10*3/uL (ref 4.0–10.5)
nRBC: 0 % (ref 0.0–0.2)

## 2023-03-22 LAB — BASIC METABOLIC PANEL
Anion gap: 14 (ref 5–15)
BUN: 10 mg/dL (ref 6–20)
CO2: 20 mmol/L — ABNORMAL LOW (ref 22–32)
Calcium: 9.3 mg/dL (ref 8.9–10.3)
Chloride: 102 mmol/L (ref 98–111)
Creatinine, Ser: 0.88 mg/dL (ref 0.44–1.00)
GFR, Estimated: >60 mL/min (ref >60)
Glucose, Bld: 68 mg/dL — ABNORMAL LOW (ref 70–99)
Potassium: 3.9 mmol/L (ref 3.5–5.1)
Sodium: 136 mmol/L (ref 135–145)

## 2023-03-22 LAB — RESP PANEL BY RT-PCR (RSV, FLU A&B, COVID)  RVPGX2
Influenza A by PCR: NEGATIVE
Influenza B by PCR: NEGATIVE
Resp Syncytial Virus by PCR: NEGATIVE
SARS Coronavirus 2 by RT PCR: NEGATIVE

## 2023-03-22 LAB — CBG MONITORING, ED: Glucose-Capillary: 127 mg/dL — ABNORMAL HIGH (ref 70–99)

## 2023-03-22 MED ORDER — IBUPROFEN 400 MG PO TABS
600.0000 mg | ORAL_TABLET | Freq: Once | ORAL | Status: AC
  Administered 2023-03-22: 600 mg via ORAL
  Filled 2023-03-22: qty 1

## 2023-03-22 MED ORDER — CETIRIZINE-PSEUDOEPHEDRINE ER 5-120 MG PO TB12: 1.0000 | ORAL_TABLET | Freq: Every day | ORAL | 0 refills | Status: DC | PRN

## 2023-03-22 NOTE — ED Triage Notes (Signed)
Reports last week used a netty pot with tap water and since has had a headache and woke up this morning with neck pain and stiffness.  Afraid she has some type of infection

## 2023-03-22 NOTE — ED Provider Notes (Signed)
[facility] Provider Note   CSN: [id number] Arrival date & time: 03/22/23  5956     History  Chief Complaint  Patient presents with   Torticollis    Jawanna Ho is a 37 y.o. female.  HPI   37 year old female presents emergency department with complaints of headache, neck pain.  Patient states that symptoms began this past Thursday when she was doing Nettie pot full of tap water.  She states that she read online that using an antibiotic prophylactically for COVID treatment with decrease likelihood of development of COVID infection.  Patient was asymptomatic when she performed Nettie pot.  States she has a 23-month-old child at home and has had increased feelings of anxiousness regarding passing illness onto her young child.  States that her husband told her she is not supposed to use tap water and she subsequently use distilled water to "wash out sinuses."  States that since doing so, has had frontal headache as well as some discomfort in her maxillary sinuses.  Denies any fever, cough, sore throat, nasal congestion, chest pain, shortness of breath, abdominal pain, nausea, vomiting.  She states that yesterday morning, felt like she may have slept on her neck within normal way and developed some posterior neck pain which improved with movement of her neck.  No second symptoms this morning which again improved with movement.  States she tried at home Mucinex which helped symptoms some.  Presents emergency department for further assessment.  Patient requesting CT study for rule out of "infection of sinuses."  Past medical history significant for IBS, vitamin D deficiency.  Home Medications Prior to Admission medications   Medication Sig Start Date End Date Taking? Authorizing Provider  cetirizine-pseudoephedrine (ZYRTEC-D) 5-120 MG tablet Take 1 tablet by mouth daily as needed for allergies. 03/22/23  Yes Sherian Maroon A, PA  buPROPion (WELLBUTRIN  XL) 150 MG 24 hr tablet Take 1 tablet (150 mg total) by mouth daily. 11/27/22   Dale Thebes, FNP  cholecalciferol (VITAMIN D3) 25 MCG (1000 UNIT) tablet Take 1,000 Units by mouth daily.    [provider]  doxylamine, Sleep, (UNISOM) 25 MG tablet Take 25 mg by mouth at bedtime as needed.    [provider]  ibuprofen (ADVIL) 600 MG tablet Take 1 tablet (600 mg total) by mouth every 6 (six) hours. 11/27/22   [place], Lesly Rubenstein, FNP  ondansetron (ZOFRAN-ODT) 4 MG disintegrating tablet Take 4 mg by mouth every 8 (eight) hours as needed for nausea or vomiting.    [provider]  Prenatal Vit-Fe Fumarate-FA (PRENATAL MULTIVITAMIN) TABS tablet Take 1 tablet by mouth daily at 12 noon.    [provider]      Allergies    Patient has no known allergies.    Review of Systems   Review of Systems  All other systems reviewed and are negative.   Physical Exam Updated Vital Signs BP 121/77   Pulse 86   Temp 99.3 F (37.4 C) (Oral)   Resp 18   Ht 5\' 7"  (1.702 m)   Wt 74.8 kg   SpO2 100%   BMI 25.84 kg/m  Physical Exam Vitals and nursing note reviewed.  Constitutional:      General: She is not in acute distress.    Appearance: She is well-developed.  HENT:     Head: Normocephalic and atraumatic.     Ears:     Comments: Patient with clear serous fluid level behind bilateral TMs.  TMs nonerythematous in appearance without exudate.  External auditory canals unremarkable bilaterally.    Nose: Nose normal.     Mouth/Throat:     Mouth: Mucous membranes are moist.     Pharynx: Oropharynx is clear. No oropharyngeal exudate or posterior oropharyngeal erythema.  Eyes:     Conjunctiva/sclera: Conjunctivae normal.  Neck:     Comments: Kernig and Brudzinski negative.  Patient with tenderness to palpation of bilateral paraspinal musculature in cervical spine.  Full active range of motion of neck. Cardiovascular:     Rate and Rhythm: Normal rate and regular rhythm.      Pulses: Normal pulses.     Heart sounds: No murmur heard. Pulmonary:     Effort: Pulmonary effort is normal. No respiratory distress.     Breath sounds: Normal breath sounds. No wheezing, rhonchi or rales.  Abdominal:     Palpations: Abdomen is soft.     Tenderness: There is no abdominal tenderness.  Musculoskeletal:        General: No swelling.     Cervical back: Neck supple. No rigidity.     Right lower leg: No edema.     Left lower leg: No edema.  Skin:    General: Skin is warm and dry.     Capillary Refill: Capillary refill takes less than 2 seconds.  Neurological:     Mental Status: She is alert.     Comments: Alert and oriented to self, place, time and event.   Speech is fluent, clear without dysarthria or dysphasia.   Strength 5/5 in upper/lower extremities   Sensation intact in upper/lower extremities   Normal gait.  Negative Romberg. No pronator drift.  Normal finger-to-nose and feet tapping.  CN I not tested  CN II not tested CN III, IV, VI PERRLA and EOMs intact bilaterally  CN V Intact sensation to sharp and light touch to the face  CN VII facial movements symmetric  CN VIII not tested  CN IX, X no uvula deviation, symmetric rise of soft palate  CN XI 5/5 SCM and trapezius strength bilaterally  CN XII Midline tongue protrusion, symmetric L/R movements     Psychiatric:        Mood and Affect: Mood normal.     ED Results / Procedures / Treatments   Labs (all labs ordered are listed, but only abnormal results are displayed) Labs Reviewed  BASIC METABOLIC PANEL - Abnormal; Notable for the following components:      Result Value   CO2 20 (*)    Glucose, Bld 68 (*)    All other components within normal limits  CBG MONITORING, ED - Abnormal; Notable for the following components:   Glucose-Capillary 127 (*)    All other components within normal limits  RESP PANEL BY RT-PCR (RSV, FLU A&B, COVID)  RVPGX2  CBC WITH DIFFERENTIAL/PLATELET     EKG None  Radiology CT Head Wo Contrast  Result Date: 03/22/2023 CLINICAL DATA:  37 year old female with headache, neck pain, and stiffness after using neti pot with tap water last week. EXAM: CT HEAD WITHOUT CONTRAST TECHNIQUE: Contiguous axial images were obtained from the base of the skull through the vertex without intravenous contrast. RADIATION DOSE REDUCTION: This exam was performed according to the departmental dose-optimization program which includes automated exposure control, adjustment of the mA and/or kV according to patient size and/or use of iterative reconstruction technique. COMPARISON:  None Available. FINDINGS: Brain: Cerebral volume is within normal limits. No midline shift, ventriculomegaly, mass effect,  evidence of mass lesion, intracranial hemorrhage or evidence of cortically based acute infarction. Gray-white matter differentiation is within normal limits throughout the brain. Vascular: No suspicious intracranial vascular hyperdensity. Skull: No osseous abnormality identified. Sinuses/Orbits: Visualized paranasal sinuses and mastoids are clear. Other: Visualized orbits and scalp soft tissues are within normal limits. IMPRESSION: Normal noncontrast CT appearance of the brain. Note that normal imaging does not exclude meningitis. Electronically Signed   By: Odessa Fleming M.D.   On: 03/22/2023 10:16    Procedures Procedures    Medications Ordered in ED Medications  ibuprofen (ADVIL) tablet 600 mg (600 mg Oral Given 03/22/23 1111)    ED Course/ Medical Decision Making/ A&P Clinical Course as of 03/22/23 1204  Wed Mar 22, 2023  1056 Glucose(!): 68 Patient states that she has had nothing to eat or drink since late yesterday afternoon for dinner.  Will have her PO challenge and recheck the [facility] [CR]    Clinical Course User Index [CR] Peter Garter, PA                             Medical Decision Making Amount and/or Complexity of Data Reviewed Labs:  ordered. Radiology: ordered.   This patient presents to the ED for concern of headache, neck pain, this involves an extensive number of treatment options, and is a complaint that carries with it a high risk of complications and morbidity.  The differential diagnosis includes encephalitis, meningitis, CVA, cerebral venous thrombosis, carotid artery/vertebral artery dissection, migraine/tension/cluster headache, sinusitis, abscess   Co morbidities that complicate the patient evaluation  See HPI   Additional history obtained:  Additional history obtained from EMR External records from outside source obtained and reviewed including hospital records   Lab Tests:  I Ordered, and personally interpreted labs.  The pertinent results include: No leukocytosis.  No evidence of anemia.  Platelets within normal range.  Mild decrease in bicarb of 20 but otherwise, electrolytes within normal limits.  Hypoglycemia of 68.  Renal function within normal limits.  Respiratory viral panel negative.  Repeat CBG 127   Imaging Studies ordered:  I ordered imaging studies including CT head I independently visualized and interpreted imaging which showed no acute abnormalities I agree with the radiologist interpretation   Cardiac Monitoring: / EKG:  The patient was maintained on a cardiac monitor.  I personally viewed and interpreted the cardiac monitored which showed an underlying rhythm of: Sinus rhythm   Consultations Obtained:  N/a   Problem List / ED Course / Critical interventions / Medication management  Patient declined any treatment while in the ED Reevaluation of the patient showed that the patient stayed the same I have reviewed the patients home medicines and have made adjustments as needed   Social Determinants of Health:  Denies tobacco, illicit drug use   Test / Admission - Considered:  Headache, ear ringing Vitals signs within normal range and stable throughout  visit. Laboratory/imaging studies significant for: Sinus rhythm 37 year old female presents emergency department complaints of frontal headache, bilateral ear ringing as well as neck pain.  Regarding headache, patient without any acute neurologic deficits visual frontal nature as well as symptoms of sinus pressure, CT imaging was performed for assessment of possible abscess formation versus other intracranial abnormality.  CT imaging was negative for any acute abnormality.  Low suspicion for sinus abscess, CVA.  Patient without fever, leukocytosis, nuchal rigidity, very low suspicion for meningitis/encephalitis.  I suspect patient's symptoms  are most likely secondary to migraine type headache.  Treated with Motrin while emergency department and noted improvement of symptoms.  Regarding bilateral ear ringing, patient with evidence of serous fluid level bilateral ears without evidence of erythema, purulent drainage.  I suspect symptoms most likely secondary to viral URI will treat with antihistamines in the outpatient setting and have her follow-up with primary care.  Regarding neck stiffness/pain.  Patient with full range of motion of neck with out nuchal rigidity; Kernig and presents key negative.  Low suspicion for meningitis.  Patient symptoms seem to be improving throughout the day and seem to be worse in the morning after awakening.  Patient with overlying muscular tenderness in the cervical region; suspect symptoms are most likely secondary to muscular strain. Will recommend NSAIDs in the outpatient setting for treatment of patient's neck pain.  Laboratory studies reassuring but patient found with evidence of mild hyperglycemia with glucose of 68 most likely secondary to no p.o. intake since yesterday evening for dinner; patient with trial of p.o. with repeat CBG within normal limits.  Will recommend continued regular meals as mildly low CBG most likely secondary to fasting.   Treatment plan discussed at  length with patient and she acknowledged understanding and was agreeable to said plan.  Patient overall well-appearing, afebrile in no acute distress. Worrisome signs and symptoms were discussed with the patient, and the patient acknowledged understanding to return to the ED if noticed. Patient was stable upon discharge.          Final Clinical Impression(s) / ED Diagnoses Final diagnoses:  Nonintractable headache, unspecified chronicity pattern, unspecified headache type  Tinnitus of both ears    Rx / DC Orders ED Discharge Orders          Ordered    cetirizine-pseudoephedrine (ZYRTEC-D) 5-120 MG tablet  Daily PRN        03/22/23 1149              Peter Garter, [place] 03/22/23 1204    Terald Sleeper, MD 03/22/23 1343

## 2023-03-22 NOTE — Discharge Instructions (Addendum)
As discussed, no evidence of abnormality on the CT scan of your head.  Blood work was reassuring.  Your glucose was slightly low but I think this is probably because you did not have anything to eat or drink today.  Recommend continued regular meals.  Regarding ear ringing, you do have some clear fluid behind your ear and will recommend an allergy medicine such as Zyrtec with decongestant.  Recommend treatment of headache with Tylenol/Motrin and follow-up with primary care for reassessment of your symptoms.  If you develop fever, neurologic abnormality, persistent neck stiffness, or other abnormality we discussed, please return to emergency.  Department

## 2023-03-23 ENCOUNTER — Other Ambulatory Visit: Payer: Self-pay | Admitting: Family Medicine

## 2023-03-23 DIAGNOSIS — R519 Headache, unspecified: Secondary | ICD-10-CM

## 2023-03-27 DIAGNOSIS — F411 Generalized anxiety disorder: Secondary | ICD-10-CM | POA: Diagnosis not present

## 2023-03-27 DIAGNOSIS — F3289 Other specified depressive episodes: Secondary | ICD-10-CM | POA: Diagnosis not present

## 2023-04-03 DIAGNOSIS — F3289 Other specified depressive episodes: Secondary | ICD-10-CM | POA: Diagnosis not present

## 2023-04-03 DIAGNOSIS — F411 Generalized anxiety disorder: Secondary | ICD-10-CM | POA: Diagnosis not present

## 2023-04-10 DIAGNOSIS — F3289 Other specified depressive episodes: Secondary | ICD-10-CM | POA: Diagnosis not present

## 2023-04-10 DIAGNOSIS — F411 Generalized anxiety disorder: Secondary | ICD-10-CM | POA: Diagnosis not present

## 2023-04-17 DIAGNOSIS — F3289 Other specified depressive episodes: Secondary | ICD-10-CM | POA: Diagnosis not present

## 2023-04-17 DIAGNOSIS — F411 Generalized anxiety disorder: Secondary | ICD-10-CM | POA: Diagnosis not present

## 2023-04-18 DIAGNOSIS — F411 Generalized anxiety disorder: Secondary | ICD-10-CM | POA: Diagnosis not present

## 2023-04-18 DIAGNOSIS — F4323 Adjustment disorder with mixed anxiety and depressed mood: Secondary | ICD-10-CM | POA: Diagnosis not present

## 2023-04-20 DIAGNOSIS — F4322 Adjustment disorder with anxiety: Secondary | ICD-10-CM | POA: Diagnosis not present

## 2023-04-20 DIAGNOSIS — F411 Generalized anxiety disorder: Secondary | ICD-10-CM | POA: Diagnosis not present

## 2023-04-27 DIAGNOSIS — D229 Melanocytic nevi, unspecified: Secondary | ICD-10-CM | POA: Diagnosis not present

## 2023-04-27 DIAGNOSIS — L821 Other seborrheic keratosis: Secondary | ICD-10-CM | POA: Diagnosis not present

## 2023-04-27 DIAGNOSIS — L570 Actinic keratosis: Secondary | ICD-10-CM | POA: Diagnosis not present

## 2023-04-27 DIAGNOSIS — D1801 Hemangioma of skin and subcutaneous tissue: Secondary | ICD-10-CM | POA: Diagnosis not present

## 2023-04-27 DIAGNOSIS — L738 Other specified follicular disorders: Secondary | ICD-10-CM | POA: Diagnosis not present

## 2023-05-01 DIAGNOSIS — F3289 Other specified depressive episodes: Secondary | ICD-10-CM | POA: Diagnosis not present

## 2023-05-01 DIAGNOSIS — F411 Generalized anxiety disorder: Secondary | ICD-10-CM | POA: Diagnosis not present

## 2023-05-02 DIAGNOSIS — F411 Generalized anxiety disorder: Secondary | ICD-10-CM | POA: Diagnosis not present

## 2023-05-02 DIAGNOSIS — F4323 Adjustment disorder with mixed anxiety and depressed mood: Secondary | ICD-10-CM | POA: Diagnosis not present

## 2023-05-08 DIAGNOSIS — F3289 Other specified depressive episodes: Secondary | ICD-10-CM | POA: Diagnosis not present

## 2023-05-08 DIAGNOSIS — F411 Generalized anxiety disorder: Secondary | ICD-10-CM | POA: Diagnosis not present

## 2023-05-15 ENCOUNTER — Encounter: Payer: Self-pay | Admitting: Sports Medicine

## 2023-05-15 DIAGNOSIS — F411 Generalized anxiety disorder: Secondary | ICD-10-CM | POA: Diagnosis not present

## 2023-05-15 DIAGNOSIS — F3289 Other specified depressive episodes: Secondary | ICD-10-CM | POA: Diagnosis not present

## 2023-05-16 DIAGNOSIS — F411 Generalized anxiety disorder: Secondary | ICD-10-CM | POA: Diagnosis not present

## 2023-05-16 DIAGNOSIS — F4323 Adjustment disorder with mixed anxiety and depressed mood: Secondary | ICD-10-CM | POA: Diagnosis not present

## 2023-05-16 NOTE — Progress Notes (Deleted)
    Aleen Sells D.[facility] [address] [place] [address] Phone: [phone]   Assessment and Plan:     There are no diagnoses linked to this encounter.  ***   Pertinent previous records reviewed include ***   Follow Up: ***     Subjective:   I, Doloras Tellado, am serving as a Neurosurgeon for Doctor Richardean Sale  Chief Complaint: right shoulder pain   HPI:   05/17/2023 Patient is a 37 year old female complaining of right shoulder pain. Patient states  Relevant Historical Information: ***  Additional pertinent review of systems negative.   Current Outpatient Medications:    buPROPion (WELLBUTRIN XL) 150 MG 24 hr tablet, Take 1 tablet (150 mg total) by mouth daily., Disp: 30 tablet, Rfl: 1   cetirizine-pseudoephedrine (ZYRTEC-D) 5-120 MG tablet, Take 1 tablet by mouth daily as needed for allergies., Disp: 30 tablet, Rfl: 0   cholecalciferol (VITAMIN D3) 25 MCG (1000 UNIT) tablet, Take 1,000 Units by mouth daily., Disp: , Rfl:    doxylamine, Sleep, (UNISOM) 25 MG tablet, Take 25 mg by mouth at bedtime as needed., Disp: , Rfl:    ibuprofen (ADVIL) 600 MG tablet, Take 1 tablet (600 mg total) by mouth every 6 (six) hours., Disp: 30 tablet, Rfl: 0   ondansetron (ZOFRAN-ODT) 4 MG disintegrating tablet, Take 4 mg by mouth every 8 (eight) hours as needed for nausea or vomiting., Disp: , Rfl:    Prenatal Vit-Fe Fumarate-FA (PRENATAL MULTIVITAMIN) TABS tablet, Take 1 tablet by mouth daily at 12 noon., Disp: , Rfl:    Objective:     There were no vitals filed for this visit.    There is no height or weight on file to calculate BMI.    Physical Exam:    ***   Electronically signed by:  Aleen Sells D.[facility] 7:04 AM 05/16/23

## 2023-05-17 ENCOUNTER — Ambulatory Visit: Payer: BLUE CROSS/BLUE SHIELD | Admitting: Sports Medicine

## 2023-05-18 NOTE — Progress Notes (Signed)
Melanie Ho D.[facility] [address] [place] [address] Phone: [phone]   Assessment and Plan:     1. Subacromial bursitis of right shoulder joint 2. Acute pain of right shoulder -Acute, initial sports medicine visit - Most consistent with subacromial bursitis likely causing pinching and irritation of nerves in thoracic outlet region leading to numbness and tingling in right second digit - Start meloxicam 15 mg daily x2 weeks.  If still having pain after 2 weeks, complete 3rd-week of meloxicam. May use remaining meloxicam as needed once daily for pain control.  Do not to use additional NSAIDs while taking meloxicam.  May use Tylenol [phone] mg 2 to 3 times a day for breakthrough pain. - Start HEP for rotator cuff and trapezius   Pertinent previous records reviewed include none   Follow Up: 3 weeks for reevaluation.  If no improvement or worsening of symptoms, could consider subacromial CSI   Subjective:   I, Melanie Ho, am serving as a Neurosurgeon for Doctor Richardean Sale  Chief Complaint: right shoulder pain   HPI:   05/19/2023 Patient is a 37 year old female complaining of right shoulder pain. Patient states that she has killer muscular shoulder pain that radiated to the neck and back and numbness that goes down to the right index finger. Week ago onset no MOI. Decreased ROM. Tylenol for the pain that helped a little to help her sleep through the night. Decrease in strength. Hx of family rotator cuff issues. She feels a clunk when rolling in her sleep not painful   Relevant Historical Information: None pertinent  Additional pertinent review of systems negative.   Current Outpatient Medications:    meloxicam (MOBIC) 15 MG tablet, Take 1 tablet (15 mg total) by mouth daily., Disp: 30 tablet, Rfl: 0   buPROPion (WELLBUTRIN XL) 150 MG 24 hr tablet, Take 1 tablet (150 mg total) by mouth daily., Disp: 30 tablet, Rfl: 1    cetirizine-pseudoephedrine (ZYRTEC-D) 5-120 MG tablet, Take 1 tablet by mouth daily as needed for allergies., Disp: 30 tablet, Rfl: 0   cholecalciferol (VITAMIN D3) 25 MCG (1000 UNIT) tablet, Take 1,000 Units by mouth daily., Disp: , Rfl:    doxylamine, Sleep, (UNISOM) 25 MG tablet, Take 25 mg by mouth at bedtime as needed., Disp: , Rfl:    ibuprofen (ADVIL) 600 MG tablet, Take 1 tablet (600 mg total) by mouth every 6 (six) hours., Disp: 30 tablet, Rfl: 0   ondansetron (ZOFRAN-ODT) 4 MG disintegrating tablet, Take 4 mg by mouth every 8 (eight) hours as needed for nausea or vomiting., Disp: , Rfl:    Prenatal Vit-Fe Fumarate-FA (PRENATAL MULTIVITAMIN) TABS tablet, Take 1 tablet by mouth daily at 12 noon., Disp: , Rfl:    Objective:     Vitals:   05/19/23 0832  BP: 118/78  Pulse: 92  SpO2: 98%  Weight: 163 lb (73.9 kg)  Height: 5\' 7"  (1.702 m)      Body mass index is 25.53 kg/m.    Physical Exam:    Gen: Appears well, nad, nontoxic and pleasant Neuro: Decree sensation over right second digit compared to left, otherwise sensation intact, strength is 5/5 with df/pf/inv/ev, muscle tone wnl Skin: no suspicious lesion or defmority Psych: A&O, appropriate mood and affect  Right shoulder:  No deformity, swelling or muscle wasting No scapular winging FF 180 with painful arc, abd 180, int 0, ext 90 NTTP over the [place], clavicle, ac, coracoid, biceps groove, humerus,  deltoid, trapezius, cervical spine Positive Neer, Hawkins, empty can Neg  obriens, crossarm, subscap liftoff, speeds Neg ant drawer, sulcus sign, apprehension Negative Spurling's test bilat FROM of neck    Electronically signed by:  Melanie Ho D.[facility] 9:02 AM 05/19/23

## 2023-05-19 ENCOUNTER — Ambulatory Visit (INDEPENDENT_AMBULATORY_CARE_PROVIDER_SITE_OTHER): Payer: BC Managed Care – PPO | Admitting: Sports Medicine

## 2023-05-19 VITALS — BP 118/78 | HR 92 | Ht 67.0 in | Wt 163.0 lb

## 2023-05-19 DIAGNOSIS — M7551 Bursitis of right shoulder: Secondary | ICD-10-CM | POA: Diagnosis not present

## 2023-05-19 DIAGNOSIS — M25511 Pain in right shoulder: Secondary | ICD-10-CM | POA: Diagnosis not present

## 2023-05-19 MED ORDER — MELOXICAM 15 MG PO TABS: 15.0000 mg | ORAL_TABLET | Freq: Every day | ORAL | 0 refills | Status: AC

## 2023-05-19 NOTE — Patient Instructions (Signed)
-   Start meloxicam 15 mg daily x2 weeks.  If still having pain after 2 weeks, complete 3rd-week of meloxicam. May use remaining meloxicam as needed once daily for pain control.  Do not to use additional NSAIDs while taking meloxicam.  May use Tylenol [phone] mg 2 to 3 times a day for breakthrough pain. Shoulder HEP  3 week follow up

## 2023-05-22 DIAGNOSIS — F411 Generalized anxiety disorder: Secondary | ICD-10-CM | POA: Diagnosis not present

## 2023-05-22 DIAGNOSIS — F3289 Other specified depressive episodes: Secondary | ICD-10-CM | POA: Diagnosis not present

## 2023-05-29 DIAGNOSIS — F4323 Adjustment disorder with mixed anxiety and depressed mood: Secondary | ICD-10-CM | POA: Diagnosis not present

## 2023-05-29 DIAGNOSIS — F411 Generalized anxiety disorder: Secondary | ICD-10-CM | POA: Diagnosis not present

## 2023-06-08 NOTE — Progress Notes (Signed)
    Melanie Ho D.[facility] [address] [place] [address] Phone: [phone]   Assessment and Plan:     1. Strain of cervical portion of right trapezius muscle 2. Acute pain of right shoulder - Subacute, improving, subsequent visit -Continued tension through shoulder girdle and right trapezius likely causing discomfort and irritation of C6 nerve based on continued tingling in C6 distribution - reStart meloxicam 15 mg daily x2 weeks.  If still having pain after 2 weeks, complete 3rd-week of meloxicam. May use remaining meloxicam as needed once daily for pain control.  Do not to use additional NSAIDs while taking meloxicam.  May use Tylenol [phone] mg 2 to 3 times a day for breakthrough pain.  -Continue HEP and start physical therapy.  Referral sent  Pertinent previous records reviewed include none   Follow Up: 4 weeks for reevaluation.  If no improvement or worsening of symptoms, would obtain C-spine x-ray and could discuss nerve conduction study versus prednisone course   Subjective:   I, Melanie Ho, am serving as a Neurosurgeon for Doctor Richardean Sale   Chief Complaint: right shoulder pain    HPI:    05/19/2023 Patient is a 37 year old female complaining of right shoulder pain. Patient states that she has killer muscular shoulder pain that radiated to the neck and back and numbness that goes down to the right index finger. Week ago onset no MOI. Decreased ROM. Tylenol for the pain that helped a little to help her sleep through the night. Decrease in strength. Hx of family rotator cuff issues. She feels a clunk when rolling in her sleep not painful   06/09/2023 Patient states that she feels pretty good, neck is still "funky". Still has numbness right hand second digit     Relevant Historical Information: None pertinent  Additional pertinent review of systems negative.   Current Outpatient Medications:    meloxicam (MOBIC) 15 MG  tablet, Take 1 tablet (15 mg total) by mouth daily., Disp: 30 tablet, Rfl: 0   Prenatal Vit-Fe Fumarate-FA (PRENATAL MULTIVITAMIN) TABS tablet, Take 1 tablet by mouth daily at 12 noon., Disp: , Rfl:    PRISTIQ 25 MG 24 hr tablet, , Disp: , Rfl:    Objective:     Vitals:   06/09/23 0844  BP: 108/80  Pulse: 68  SpO2: 99%  Weight: 142 lb (64.4 kg)  Height: 5\' 7"  (1.702 m)      Body mass index is 22.24 kg/m.    Physical Exam:    Gen: Appears well, nad, nontoxic and pleasant Neuro: Decreased sensation over right second digit and lateral forearm compared to left, otherwise sensation intact, strength is 5/5 with df/pf/inv/ev, muscle tone wnl Skin: no suspicious lesion or defmority Psych: A&O, appropriate mood and affect   Right shoulder:  No deformity, swelling or muscle wasting No scapular winging FF 180  , abd 180, int 0, ext 90 TTP right trapezius NTTP over the [place], clavicle, ac, coracoid, biceps groove, humerus, deltoid,  , cervical spine Negative Neer, Hawkins, empty can Neg  obriens, crossarm, subscap liftoff, speeds Neg ant drawer, sulcus sign, apprehension Negative Spurling's test bilat FROM of neck     Electronically signed by:  Melanie Ho D.[facility] 9:05 AM 06/09/23

## 2023-06-09 ENCOUNTER — Ambulatory Visit: Payer: BC Managed Care – PPO | Admitting: Sports Medicine

## 2023-06-09 VITALS — BP 108/80 | HR 68 | Ht 67.0 in | Wt 142.0 lb

## 2023-06-09 DIAGNOSIS — S161XXA Strain of muscle, fascia and tendon at neck level, initial encounter: Secondary | ICD-10-CM

## 2023-06-09 DIAGNOSIS — M25511 Pain in right shoulder: Secondary | ICD-10-CM | POA: Diagnosis not present

## 2023-06-09 NOTE — Patient Instructions (Signed)
-   Start meloxicam 15 mg daily x2 weeks.  If still having pain after 2 weeks, complete 3rd-week of meloxicam. May use remaining meloxicam as needed once daily for pain control.  Do not to use additional NSAIDs while taking meloxicam.  May use Tylenol [phone] mg 2 to 3 times a day for breakthrough pain. Continue HEP  PT referral  4 week follow up

## 2023-06-13 DIAGNOSIS — F3289 Other specified depressive episodes: Secondary | ICD-10-CM | POA: Diagnosis not present

## 2023-06-13 DIAGNOSIS — F411 Generalized anxiety disorder: Secondary | ICD-10-CM | POA: Diagnosis not present

## 2023-06-15 DIAGNOSIS — F4323 Adjustment disorder with mixed anxiety and depressed mood: Secondary | ICD-10-CM | POA: Diagnosis not present

## 2023-06-15 DIAGNOSIS — F411 Generalized anxiety disorder: Secondary | ICD-10-CM | POA: Diagnosis not present

## 2023-06-19 DIAGNOSIS — F411 Generalized anxiety disorder: Secondary | ICD-10-CM | POA: Diagnosis not present

## 2023-06-19 DIAGNOSIS — F3289 Other specified depressive episodes: Secondary | ICD-10-CM | POA: Diagnosis not present

## 2023-06-29 DIAGNOSIS — Z0000 Encounter for general adult medical examination without abnormal findings: Secondary | ICD-10-CM | POA: Diagnosis not present

## 2023-07-03 DIAGNOSIS — F411 Generalized anxiety disorder: Secondary | ICD-10-CM | POA: Diagnosis not present

## 2023-07-03 DIAGNOSIS — F3289 Other specified depressive episodes: Secondary | ICD-10-CM | POA: Diagnosis not present

## 2023-07-04 DIAGNOSIS — F411 Generalized anxiety disorder: Secondary | ICD-10-CM | POA: Diagnosis not present

## 2023-07-04 DIAGNOSIS — F4323 Adjustment disorder with mixed anxiety and depressed mood: Secondary | ICD-10-CM | POA: Diagnosis not present

## 2023-07-07 ENCOUNTER — Ambulatory Visit: Payer: BC Managed Care – PPO | Admitting: Sports Medicine

## 2023-07-17 DIAGNOSIS — F411 Generalized anxiety disorder: Secondary | ICD-10-CM | POA: Diagnosis not present

## 2023-07-17 DIAGNOSIS — F3289 Other specified depressive episodes: Secondary | ICD-10-CM | POA: Diagnosis not present

## 2023-08-07 DIAGNOSIS — F411 Generalized anxiety disorder: Secondary | ICD-10-CM | POA: Diagnosis not present

## 2023-08-07 DIAGNOSIS — F3289 Other specified depressive episodes: Secondary | ICD-10-CM | POA: Diagnosis not present

## 2023-08-14 DIAGNOSIS — F3289 Other specified depressive episodes: Secondary | ICD-10-CM | POA: Diagnosis not present

## 2023-08-14 DIAGNOSIS — F411 Generalized anxiety disorder: Secondary | ICD-10-CM | POA: Diagnosis not present

## 2023-08-28 DIAGNOSIS — F3289 Other specified depressive episodes: Secondary | ICD-10-CM | POA: Diagnosis not present

## 2023-08-28 DIAGNOSIS — F411 Generalized anxiety disorder: Secondary | ICD-10-CM | POA: Diagnosis not present

## 2023-09-05 DIAGNOSIS — F411 Generalized anxiety disorder: Secondary | ICD-10-CM | POA: Diagnosis not present

## 2023-09-05 DIAGNOSIS — F4323 Adjustment disorder with mixed anxiety and depressed mood: Secondary | ICD-10-CM | POA: Diagnosis not present

## 2023-09-15 DIAGNOSIS — F3289 Other specified depressive episodes: Secondary | ICD-10-CM | POA: Diagnosis not present

## 2023-09-15 DIAGNOSIS — F411 Generalized anxiety disorder: Secondary | ICD-10-CM | POA: Diagnosis not present

## 2023-09-19 DIAGNOSIS — F411 Generalized anxiety disorder: Secondary | ICD-10-CM | POA: Diagnosis not present

## 2023-09-19 DIAGNOSIS — F4323 Adjustment disorder with mixed anxiety and depressed mood: Secondary | ICD-10-CM | POA: Diagnosis not present

## 2023-09-25 DIAGNOSIS — F3289 Other specified depressive episodes: Secondary | ICD-10-CM | POA: Diagnosis not present

## 2023-09-25 DIAGNOSIS — Z0000 Encounter for general adult medical examination without abnormal findings: Secondary | ICD-10-CM | POA: Diagnosis not present

## 2023-09-25 DIAGNOSIS — F411 Generalized anxiety disorder: Secondary | ICD-10-CM | POA: Diagnosis not present

## 2023-10-09 DIAGNOSIS — F411 Generalized anxiety disorder: Secondary | ICD-10-CM | POA: Diagnosis not present

## 2023-10-09 DIAGNOSIS — F3289 Other specified depressive episodes: Secondary | ICD-10-CM | POA: Diagnosis not present

## 2023-10-17 DIAGNOSIS — F4323 Adjustment disorder with mixed anxiety and depressed mood: Secondary | ICD-10-CM | POA: Diagnosis not present

## 2023-10-17 DIAGNOSIS — F411 Generalized anxiety disorder: Secondary | ICD-10-CM | POA: Diagnosis not present

## 2023-10-18 DIAGNOSIS — Z113 Encounter for screening for infections with a predominantly sexual mode of transmission: Secondary | ICD-10-CM | POA: Diagnosis not present

## 2023-10-18 DIAGNOSIS — Z01419 Encounter for gynecological examination (general) (routine) without abnormal findings: Secondary | ICD-10-CM | POA: Diagnosis not present

## 2023-10-27 DIAGNOSIS — F3289 Other specified depressive episodes: Secondary | ICD-10-CM | POA: Diagnosis not present

## 2023-10-27 DIAGNOSIS — F411 Generalized anxiety disorder: Secondary | ICD-10-CM | POA: Diagnosis not present

## 2023-10-27 NOTE — Progress Notes (Signed)
    Melanie Ho D.[facility] [address] [place] [address] Phone: [phone]   Assessment and Plan:     1. Acute pain of right knee - Acute, uncomplicated, initial visit - Consistent with right knee pain primarily in patellofemoral compartment x 2 weeks that is overall improving.  Suspect inflammation from kneeling to pick up her 38-year-old and aggravation from restarting physical activity - No red flag symptoms, so no imaging at today's visit - Continue physical activity as tolerated - Start HEP for patellofemoral syndrome - Use Tylenol/NSAIDs as needed for pain relief  15 additional minutes spent for educating Therapeutic Home Exercise Program.  This included exercises focusing on stretching, strengthening, with focus on eccentric aspects.   Long term goals include an improvement in range of motion, strength, endurance as well as avoiding reinjury. Patient's frequency would include in 1-2 times a day, 3-5 times a week for a duration of 6-12 weeks. Proper technique shown and discussed handout in great detail with ATC.  All questions were discussed and answered.     Pertinent previous records reviewed include none  Follow Up: As needed if no improvement or worsening of symptoms.  Patient could call and ask for meloxicam prescription if pain persists.  Could also follow-up as needed for knee x-ray and reevaluation   Subjective:   I, Melanie Ho am a scribe for Dr. Jean Rosenthal.    Chief Complaint: right knee pain   HPI:   10/30/2023 Patient is a 38 year old female with right knee pain. Patient states feels a little better today. It is still a little tight when she kneels. 2.5 out of 10 on the pain scale today. No injury has occurred to patience's knowledge.    Relevant Historical Information: None pertinent  Additional pertinent review of systems negative.   Current Outpatient Medications:    Prenatal Vit-Fe Fumarate-FA (PRENATAL  MULTIVITAMIN) TABS tablet, Take 1 tablet by mouth daily at 12 noon., Disp: , Rfl:    PRISTIQ 25 MG 24 hr tablet, , Disp: , Rfl:    meloxicam (MOBIC) 15 MG tablet, Take 1 tablet (15 mg total) by mouth daily., Disp: 30 tablet, Rfl: 0   Objective:     Vitals:   10/30/23 1346  BP: 112/70  Pulse: 84  SpO2: 99%  Weight: 142 lb 9.6 oz (64.7 kg)  Height: 5\' 7"  (1.702 m)      Body mass index is 22.33 kg/m.    Physical Exam:    General:  awake, alert oriented, no acute distress nontoxic Skin: no suspicious lesions or rashes Neuro:sensation intact and strength 5/5 with no deficits, no atrophy, normal muscle tone Psych: No signs of anxiety, depression or other mood disorder  Right knee: Mild crepitus present Positive J sign No swelling No deformity Neg fluid wave, joint milking ROM Flex 110, Ext 0 NTTP over the quad tendon, medial fem condyle, lat fem condyle, patella, plica, patella tendon, tibial tuberostiy, fibular head, posterior fossa, pes anserine bursa, gerdy's tubercle, medial jt line, lateral jt line Neg anterior and posterior drawer Neg lachman Neg sag sign Negative varus stress Negative valgus stress Negative McMurray Negative Thessaly  Gait normal    Electronically signed by:  Melanie Ho D.[facility] 2:18 PM 10/30/23

## 2023-10-30 ENCOUNTER — Ambulatory Visit: Payer: BC Managed Care – PPO | Admitting: Sports Medicine

## 2023-10-30 VITALS — BP 112/70 | HR 84 | Ht 67.0 in | Wt 142.6 lb

## 2023-10-30 DIAGNOSIS — M25561 Pain in right knee: Secondary | ICD-10-CM

## 2023-10-30 NOTE — Patient Instructions (Addendum)
 HEP.  Tylenol and IBU as needed If kneeling kneel on a cushion. Call if you would like a meloxicam course. Return as needed.

## 2023-11-06 DIAGNOSIS — F411 Generalized anxiety disorder: Secondary | ICD-10-CM | POA: Diagnosis not present

## 2023-11-06 DIAGNOSIS — F3289 Other specified depressive episodes: Secondary | ICD-10-CM | POA: Diagnosis not present

## 2023-11-20 DIAGNOSIS — F411 Generalized anxiety disorder: Secondary | ICD-10-CM | POA: Diagnosis not present

## 2023-11-20 DIAGNOSIS — F3289 Other specified depressive episodes: Secondary | ICD-10-CM | POA: Diagnosis not present

## 2023-12-13 DIAGNOSIS — Z01419 Encounter for gynecological examination (general) (routine) without abnormal findings: Secondary | ICD-10-CM | POA: Diagnosis not present

## 2023-12-26 DIAGNOSIS — F4323 Adjustment disorder with mixed anxiety and depressed mood: Secondary | ICD-10-CM | POA: Diagnosis not present

## 2023-12-26 DIAGNOSIS — F411 Generalized anxiety disorder: Secondary | ICD-10-CM | POA: Diagnosis not present

## 2024-01-30 DIAGNOSIS — F4323 Adjustment disorder with mixed anxiety and depressed mood: Secondary | ICD-10-CM | POA: Diagnosis not present

## 2024-01-30 DIAGNOSIS — F411 Generalized anxiety disorder: Secondary | ICD-10-CM | POA: Diagnosis not present

## 2024-02-05 DIAGNOSIS — F411 Generalized anxiety disorder: Secondary | ICD-10-CM | POA: Diagnosis not present

## 2024-02-05 DIAGNOSIS — F3289 Other specified depressive episodes: Secondary | ICD-10-CM | POA: Diagnosis not present

## 2024-02-26 DIAGNOSIS — F4323 Adjustment disorder with mixed anxiety and depressed mood: Secondary | ICD-10-CM | POA: Diagnosis not present

## 2024-02-26 DIAGNOSIS — F411 Generalized anxiety disorder: Secondary | ICD-10-CM | POA: Diagnosis not present

## 2024-03-08 DIAGNOSIS — F3289 Other specified depressive episodes: Secondary | ICD-10-CM | POA: Diagnosis not present

## 2024-03-08 DIAGNOSIS — F411 Generalized anxiety disorder: Secondary | ICD-10-CM | POA: Diagnosis not present

## 2024-03-25 DIAGNOSIS — F3289 Other specified depressive episodes: Secondary | ICD-10-CM | POA: Diagnosis not present

## 2024-03-25 DIAGNOSIS — F411 Generalized anxiety disorder: Secondary | ICD-10-CM | POA: Diagnosis not present

## 2024-04-16 DIAGNOSIS — F411 Generalized anxiety disorder: Secondary | ICD-10-CM | POA: Diagnosis not present

## 2024-04-16 DIAGNOSIS — F3289 Other specified depressive episodes: Secondary | ICD-10-CM | POA: Diagnosis not present

## 2024-04-22 DIAGNOSIS — F411 Generalized anxiety disorder: Secondary | ICD-10-CM | POA: Diagnosis not present

## 2024-04-22 DIAGNOSIS — F3289 Other specified depressive episodes: Secondary | ICD-10-CM | POA: Diagnosis not present

## 2024-04-23 DIAGNOSIS — F4323 Adjustment disorder with mixed anxiety and depressed mood: Secondary | ICD-10-CM | POA: Diagnosis not present

## 2024-04-23 DIAGNOSIS — F411 Generalized anxiety disorder: Secondary | ICD-10-CM | POA: Diagnosis not present

## 2024-05-01 DIAGNOSIS — F411 Generalized anxiety disorder: Secondary | ICD-10-CM | POA: Diagnosis not present

## 2024-05-01 DIAGNOSIS — F3289 Other specified depressive episodes: Secondary | ICD-10-CM | POA: Diagnosis not present

## 2024-05-02 DIAGNOSIS — G2581 Restless legs syndrome: Secondary | ICD-10-CM | POA: Diagnosis not present

## 2024-05-02 DIAGNOSIS — I83893 Varicose veins of bilateral lower extremities with other complications: Secondary | ICD-10-CM | POA: Diagnosis not present

## 2024-05-02 DIAGNOSIS — R600 Localized edema: Secondary | ICD-10-CM | POA: Diagnosis not present

## 2024-05-02 DIAGNOSIS — R252 Cramp and spasm: Secondary | ICD-10-CM | POA: Diagnosis not present

## 2024-05-06 DIAGNOSIS — F411 Generalized anxiety disorder: Secondary | ICD-10-CM | POA: Diagnosis not present

## 2024-05-06 DIAGNOSIS — F3289 Other specified depressive episodes: Secondary | ICD-10-CM | POA: Diagnosis not present

## 2024-05-07 DIAGNOSIS — F411 Generalized anxiety disorder: Secondary | ICD-10-CM | POA: Diagnosis not present

## 2024-05-07 DIAGNOSIS — F4323 Adjustment disorder with mixed anxiety and depressed mood: Secondary | ICD-10-CM | POA: Diagnosis not present

## 2024-05-13 DIAGNOSIS — F3289 Other specified depressive episodes: Secondary | ICD-10-CM | POA: Diagnosis not present

## 2024-05-27 DIAGNOSIS — F411 Generalized anxiety disorder: Secondary | ICD-10-CM | POA: Diagnosis not present

## 2024-05-27 DIAGNOSIS — F3289 Other specified depressive episodes: Secondary | ICD-10-CM | POA: Diagnosis not present

## 2024-06-03 DIAGNOSIS — F3289 Other specified depressive episodes: Secondary | ICD-10-CM | POA: Diagnosis not present

## 2024-06-11 DIAGNOSIS — F411 Generalized anxiety disorder: Secondary | ICD-10-CM | POA: Diagnosis not present

## 2024-06-11 DIAGNOSIS — F3289 Other specified depressive episodes: Secondary | ICD-10-CM | POA: Diagnosis not present

## 2024-06-17 DIAGNOSIS — F3289 Other specified depressive episodes: Secondary | ICD-10-CM | POA: Diagnosis not present

## 2024-07-01 DIAGNOSIS — F3289 Other specified depressive episodes: Secondary | ICD-10-CM | POA: Diagnosis not present

## 2024-07-15 DIAGNOSIS — F411 Generalized anxiety disorder: Secondary | ICD-10-CM | POA: Diagnosis not present

## 2024-07-15 DIAGNOSIS — F3289 Other specified depressive episodes: Secondary | ICD-10-CM | POA: Diagnosis not present

## 2024-07-22 DIAGNOSIS — F3289 Other specified depressive episodes: Secondary | ICD-10-CM | POA: Diagnosis not present

## 2024-07-22 DIAGNOSIS — F411 Generalized anxiety disorder: Secondary | ICD-10-CM | POA: Diagnosis not present

## 2024-07-23 DIAGNOSIS — F411 Generalized anxiety disorder: Secondary | ICD-10-CM | POA: Diagnosis not present

## 2024-07-23 DIAGNOSIS — F4323 Adjustment disorder with mixed anxiety and depressed mood: Secondary | ICD-10-CM | POA: Diagnosis not present

## 2024-07-29 DIAGNOSIS — F411 Generalized anxiety disorder: Secondary | ICD-10-CM | POA: Diagnosis not present

## 2024-07-29 DIAGNOSIS — F3289 Other specified depressive episodes: Secondary | ICD-10-CM | POA: Diagnosis not present
